# Patient Record
Sex: Female | Born: 1960 | Race: White | Hispanic: No | Smoking: Current every day smoker
Health system: Southern US, Community
[De-identification: ages and names within clinical notes are randomized; demographics above are authoritative.]

## PROBLEM LIST (undated history)

## (undated) DIAGNOSIS — F419 Anxiety disorder, unspecified: Secondary | ICD-10-CM

## (undated) DIAGNOSIS — F319 Bipolar disorder, unspecified: Secondary | ICD-10-CM

## (undated) DIAGNOSIS — T7840XA Allergy, unspecified, initial encounter: Secondary | ICD-10-CM

## (undated) DIAGNOSIS — F1910 Other psychoactive substance abuse, uncomplicated: Secondary | ICD-10-CM

## (undated) DIAGNOSIS — R51 Headache: Secondary | ICD-10-CM

## (undated) HISTORY — PX: TUBAL LIGATION: SHX77

## (undated) HISTORY — DX: Other psychoactive substance abuse, uncomplicated: F19.10

## (undated) HISTORY — DX: Allergy, unspecified, initial encounter: T78.40XA

---

## 1999-12-07 ENCOUNTER — Other Ambulatory Visit: Admission: RE | Admit: 1999-12-07 | Discharge: 1999-12-07 | Payer: Self-pay | Admitting: Obstetrics and Gynecology

## 1999-12-17 ENCOUNTER — Encounter: Admission: RE | Admit: 1999-12-17 | Discharge: 1999-12-17 | Payer: Self-pay | Admitting: Family Medicine

## 1999-12-17 ENCOUNTER — Ambulatory Visit (HOSPITAL_COMMUNITY): Admission: RE | Admit: 1999-12-17 | Discharge: 1999-12-17 | Payer: Self-pay | Admitting: Obstetrics and Gynecology

## 1999-12-17 ENCOUNTER — Encounter: Payer: Self-pay | Admitting: Obstetrics and Gynecology

## 1999-12-17 ENCOUNTER — Encounter: Payer: Self-pay | Admitting: Family Medicine

## 2001-01-03 ENCOUNTER — Other Ambulatory Visit: Admission: RE | Admit: 2001-01-03 | Discharge: 2001-01-03 | Payer: Self-pay | Admitting: Obstetrics and Gynecology

## 2005-08-05 ENCOUNTER — Emergency Department (HOSPITAL_COMMUNITY): Admission: EM | Admit: 2005-08-05 | Discharge: 2005-08-05 | Payer: Self-pay | Admitting: Emergency Medicine

## 2005-08-07 ENCOUNTER — Emergency Department (HOSPITAL_COMMUNITY): Admission: EM | Admit: 2005-08-07 | Discharge: 2005-08-07 | Payer: Self-pay | Admitting: Family Medicine

## 2010-01-05 ENCOUNTER — Ambulatory Visit: Payer: Self-pay | Admitting: Cardiology

## 2010-01-05 ENCOUNTER — Encounter (INDEPENDENT_AMBULATORY_CARE_PROVIDER_SITE_OTHER): Payer: Self-pay | Admitting: Emergency Medicine

## 2010-01-05 ENCOUNTER — Observation Stay (HOSPITAL_COMMUNITY): Admission: EM | Admit: 2010-01-05 | Discharge: 2010-01-05 | Payer: Self-pay | Admitting: Emergency Medicine

## 2010-01-06 ENCOUNTER — Encounter (INDEPENDENT_AMBULATORY_CARE_PROVIDER_SITE_OTHER): Payer: Self-pay | Admitting: *Deleted

## 2010-02-22 ENCOUNTER — Ambulatory Visit: Payer: Self-pay | Admitting: Gastroenterology

## 2010-02-22 DIAGNOSIS — R079 Chest pain, unspecified: Secondary | ICD-10-CM

## 2010-02-22 DIAGNOSIS — R1310 Dysphagia, unspecified: Secondary | ICD-10-CM | POA: Insufficient documentation

## 2010-03-24 ENCOUNTER — Ambulatory Visit: Payer: Self-pay | Admitting: Gastroenterology

## 2010-03-24 DIAGNOSIS — R1011 Right upper quadrant pain: Secondary | ICD-10-CM

## 2010-03-24 LAB — CONVERTED CEMR LAB
Bilirubin, Direct: 0.1 mg/dL (ref 0.0–0.3)
Total Bilirubin: 0.6 mg/dL (ref 0.3–1.2)
Total Protein: 6.1 g/dL (ref 6.0–8.3)

## 2010-03-26 ENCOUNTER — Telehealth: Payer: Self-pay | Admitting: Gastroenterology

## 2010-03-29 ENCOUNTER — Telehealth: Payer: Self-pay | Admitting: Gastroenterology

## 2010-04-01 ENCOUNTER — Ambulatory Visit (HOSPITAL_COMMUNITY): Admission: RE | Admit: 2010-04-01 | Discharge: 2010-04-01 | Payer: Self-pay | Admitting: Gastroenterology

## 2010-04-01 ENCOUNTER — Telehealth: Payer: Self-pay | Admitting: Gastroenterology

## 2010-04-07 ENCOUNTER — Telehealth (INDEPENDENT_AMBULATORY_CARE_PROVIDER_SITE_OTHER): Payer: Self-pay | Admitting: *Deleted

## 2010-04-07 ENCOUNTER — Ambulatory Visit: Payer: Self-pay | Admitting: Gastroenterology

## 2010-04-08 DIAGNOSIS — K811 Chronic cholecystitis: Secondary | ICD-10-CM | POA: Insufficient documentation

## 2010-04-27 ENCOUNTER — Encounter: Payer: Self-pay | Admitting: Gastroenterology

## 2010-06-13 HISTORY — PX: CHOLECYSTECTOMY: SHX55

## 2010-07-04 ENCOUNTER — Encounter: Payer: Self-pay | Admitting: Obstetrics

## 2010-07-06 LAB — DIFFERENTIAL
Basophils Absolute: 0.1 10*3/uL (ref 0.0–0.1)
Basophils Relative: 1 % (ref 0–1)
Eosinophils Absolute: 0.2 10*3/uL (ref 0.0–0.7)
Eosinophils Relative: 3 % (ref 0–5)
Lymphocytes Relative: 30 % (ref 12–46)
Lymphs Abs: 1.7 10*3/uL (ref 0.7–4.0)
Monocytes Absolute: 0.5 10*3/uL (ref 0.1–1.0)
Monocytes Relative: 9 % (ref 3–12)
Neutro Abs: 3.3 10*3/uL (ref 1.7–7.7)
Neutrophils Relative %: 58 % (ref 43–77)

## 2010-07-06 LAB — COMPREHENSIVE METABOLIC PANEL WITH GFR
ALT: 14 U/L (ref 0–35)
AST: 16 U/L (ref 0–37)
Albumin: 3.6 g/dL (ref 3.5–5.2)
Alkaline Phosphatase: 76 U/L (ref 39–117)
BUN: 14 mg/dL (ref 6–23)
CO2: 27 meq/L (ref 19–32)
Calcium: 9.8 mg/dL (ref 8.4–10.5)
Chloride: 108 meq/L (ref 96–112)
Creatinine, Ser: 0.81 mg/dL (ref 0.4–1.2)
GFR calc non Af Amer: >60 mL/min
Glucose, Bld: 93 mg/dL (ref 70–99)
Potassium: 3.6 meq/L (ref 3.5–5.1)
Sodium: 144 meq/L (ref 135–145)
Total Bilirubin: 0.3 mg/dL (ref 0.3–1.2)
Total Protein: 6.6 g/dL (ref 6.0–8.3)

## 2010-07-06 LAB — CBC
HCT: 40.9 % (ref 36.0–46.0)
Hemoglobin: 13.7 g/dL (ref 12.0–15.0)
MCH: 31.4 pg (ref 26.0–34.0)
MCHC: 33.5 g/dL (ref 30.0–36.0)
MCV: 93.6 fL (ref 78.0–100.0)
Platelets: 215 K/uL (ref 150–400)
RBC: 4.37 MIL/uL (ref 3.87–5.11)
RDW: 13.9 % (ref 11.5–15.5)
WBC: 5.7 K/uL (ref 4.0–10.5)

## 2010-07-09 ENCOUNTER — Ambulatory Visit (HOSPITAL_COMMUNITY)
Admission: RE | Admit: 2010-07-09 | Discharge: 2010-07-09 | Payer: Self-pay | Source: Home / Self Care | Attending: General Surgery | Admitting: General Surgery

## 2010-07-12 NOTE — Op Note (Signed)
Deborah Cobb, Deborah Cobb                 ACCOUNT NO.:  [id number]  MEDICAL RECORD NO.:  [id number]          PATIENT TYPE:  AMB  LOCATION:  DAY                          FACILITY:  [facility]  PHYSICIAN:  Juanetta Gosling, MDDATE OF BIRTH:  [birth year]  DATE OF PROCEDURE: DATE OF DISCHARGE:                              OPERATIVE REPORT   PREOPERATIVE DIAGNOSIS:  Chronic cholecystitis.  POSTOPERATIVE DIAGNOSIS:  Chronic cholecystitis.  PROCEDURE:  Laparoscopic cholecystectomy.  SURGEON:  Juanetta Gosling, M.D.  ASSISTANT:  Thornton Park. Daphine Deutscher, M.D.  ANESTHESIA:  General.  SPECIMENS:  Gallbladder to Pathology.  BLOOD LOSS:  Minimal.  COMPLICATIONS:  None.  DRAINS:  None.  DISPOSITION:  To recovery room in stable condition.  INDICATIONS:  This is a 50 year old female with nausea, belching, epigastric and substernal pain since March.  She has been evaluated completely for a cardiac source which has been negative as well by Dr. Arlyce Dice with a negative EGD.  She does have a right upper quadrant ultrasound showing a partially contracted gallbladder with diffuse wall thickening up to 5-mm and some diffuse adenomyomatosis.  She clinically had gallbladder disease and I discussed with her a laparoscopic cholecystectomy.  PROCEDURE IN DETAIL:  After informed consent was obtained, she was taken to the operating room.  She was administered 1 gram of intravenous cefoxitin.  Sequential compression devices were placed on the lower extremities prior to induction of anesthesia.  She was then placed under general endotracheal anesthesia without complication.  Her abdomen was prepped and draped in the standard sterile surgical fashion.  A surgical time-out was then performed.  I infiltrated 0.25% Marcaine below her umbilicus and made a vertical incision with an 11 blade, identified the fascia, entered this sharply and entered the peritoneum bluntly.  A 0-Vicryl pursestring suture was placed  through the fascia.  A Hasson trocar was then introduced.  The abdomen was then insufflated to 15 mmHg pressure.  I then inserted three further 5-mm trocars in the epigastrium and right upper quadrant under direct vision after infiltration with local anesthetic without complications.  Her gallbladder was noted to be very scarred and there was omentum that was very adherent to her gallbladder.  It took about 45 minutes to dissect the omentum off of the gallbladder.  There was no bowel that was adherent to the gallbladder.  Eventually I was able to trace this down towards the triangle of Calot.  The triangle of Calot was very difficult again to dissect due to the amount of chronic inflammation she had in this region but eventually I was able to very clearly obtain a critical view of safety.  I then clipped the artery three times and divided it and treated the cystic duct in a similar fashion.  The gallbladder was then removed from the liver bed.  The gallbladder was very woody and when it was removed, I certainly sent it off to Pathology, but it did come off the liver completely.  This was placed in an EndoCatch bag and removed from the umbilicus.  Hemostasis was obtained.  I did place a piece of Surgicel snow just due  to the amount of dissection it had taken on the liver.  Irrigation was performed.  This was clear.  I then removed the Hasson trocar and tied this down.  There was no evidence of any further defect at the umbilicus.  The abdomen was then desufflated.  The remainder of her incisions were then closed with 4-0 Monocryl in a subcuticular fashion. She tolerated this well, was extubated in the operating room and transferred to the recovery room in stable condition.     Juanetta Gosling, MD     MCW/MEDQ  D:  07/09/2010  T:  07/09/2010  Job:  [id number]  cc:   Barbette Hair. Arlyce Dice, MD,FACG [address] [place] [address]  Electronically Signed by Emelia Loron MD  on 07/12/2010 11:30:11 AM

## 2010-07-15 NOTE — Assessment & Plan Note (Signed)
Summary: heartburn/vomiting/upper gastric pain--ch.   History of Present Illness Visit Type: Initial Visit Primary GI MD: Melvia Heaps MD [facility] Requesting Provider: n/a Chief Complaint: severe  chest pain/non-cardiac History of Present Illness:   Deborah Cobb is a pleasant 50 year old white female referred from  the ER for evaluation of chest pain.  She was hospitalized in July, 2011 for what turned out to be noncardiac chest pain.  She  complains of severe reflux and dysphagia to solids.  At times she has had chest pain at rest secondary to severe burning chest discomfort.  She is on no gastric irritants including nonsteroidals.  She takes Zantac one to 2 times a day.   GI Review of Systems    Reports abdominal pain, acid reflux, belching, bloating, chest pain, dysphagia with solids, and  nausea.     Location of  Abdominal pain: epigastric area.    Denies dysphagia with liquids, heartburn, loss of appetite, vomiting, vomiting blood, weight loss, and  weight gain.      Reports constipation.     Denies anal fissure, black tarry stools, change in bowel habit, diarrhea, diverticulosis, fecal incontinence, heme positive stool, hemorrhoids, irritable bowel syndrome, jaundice, light color stool, liver problems, rectal bleeding, and  rectal pain. Preventive Screening-Counseling & Management  Alcohol-Tobacco     Smoking Status: current      Drug Use:  no.      Current Medications (verified): 1)  Zantac 150 Mg Tabs (Ranitidine Hcl) .... Take 1 -2 Tablet By Mouth  Once Daily or Two Times A Day 2)  Xanax 0.25 Mg Tabs (Alprazolam) .... As Needed 3)  Ambien 5 Mg Tabs (Zolpidem Tartrate) .... As Needed At Bedtime  Allergies (verified): No Known Drug Allergies  Past History:  Past Medical History: Anxiety Disorder GERD  Past Surgical History: Breast-Lumpectomy @ 50 years of age  Family History: Family History of Stomach Cancer:Father  Social History: Occupation:RN  Patient currently  smokes.  Alcohol Use - no Daily Caffeine Use 6 Illicit Drug Use - no Smoking Status:  current Drug Use:  no  Review of Systems       The patient complains of anxiety-new, fatigue, muscle pains/cramps, and sleeping problems.  The patient denies allergy/sinus, anemia, arthritis/joint pain, back pain, blood in urine, breast changes/lumps, change in vision, confusion, cough, coughing up blood, depression-new, fainting, fever, headaches-new, hearing problems, heart murmur, heart rhythm changes, itching, menstrual pain, night sweats, nosebleeds, pregnancy symptoms, shortness of breath, skin rash, sore throat, swelling of feet/legs, swollen lymph glands, thirst - excessive , urination - excessive , urination changes/pain, urine leakage, vision changes, and voice change.         All other systems were reviewed and were negative   Vital Signs:  Patient profile:   50 year old female Height:      65 inches Weight:      159.25 pounds BMI:     26.60 Pulse rate:   68 / minute Pulse rhythm:   regular BP sitting:   104 / 70  (left arm) Cuff size:   regular  Vitals Entered By: June McMurray CMA Duncan Dull) (February 22, 2010 3:19 PM)  Physical Exam  Additional Exam:  On physical exam she is a well-developed well-nourished female  skin: anicteric HEENT: normocephalic; PEERLA; no nasal or pharyngeal abnormalities neck: supple nodes: no cervical lymphadenopathy chest: clear to ausculatation and percussion heart: no murmurs, gallops, or rubs abd: soft, nontender; BS normoactive; no abdominal masses, tenderness, organomegaly rectal: deferred ext: no  cynanosis, clubbing, edema skeletal: no deformities neuro: oriented x 3; no focal abnormalities    Impression & Recommendations:  Problem # 1:  CHEST PAIN UNSPECIFIED (ICD-786.50) Chest pain and dysphagia are probably related to esophageal stricture and esophageal reflux.  She may have a component of esophageal spasm as well.  Recommendations #1  begin Nexium 40 mg daily #2 upper endoscopy with dilatation as indicated  Risks, alternatives, and complications of the procedure, including bleeding, perforation, and possible need for surgery, were explained to the patient.  Patient's questions were answered.  Problem # 2:  SPECIAL SCREENING FOR MALIGNANT NEOPLASMS COLON (ICD-V76.51) Plan screening colonoscopy  Other Orders: EGD (EGD)  Patient Instructions: 1)  We are giving you Nexium Samples today 2)  Your Endoscopy is scheduled for 03/24/2010 at 1:30pm 3)  Please contact our office to schedule an elective colonoscopy 4)  The medication list was reviewed and reconciled.  All changed / newly prescribed medications were explained.  A complete medication list was provided to the patient / caregiver. Prescriptions: NEXIUM 40 MG CPDR (ESOMEPRAZOLE MAGNESIUM) take one tab before breakfast daily  #30 x 2   Entered and Authorized by:   Louis Meckel MD   Signed by:   Louis Meckel MD on 02/23/2010   Method used:   Electronically to        [facility] [phone]* (retail)       [address]       [place], [place]  [address]       Ph: [phone] or [phone]       Fax: [phone]   RxID:   [id number]

## 2010-07-15 NOTE — Letter (Signed)
Summary: Results Letter  [facility]  [address] [place], [place] [address]   Phone: [phone]  Fax: [phone]        February 22, 2010 MRN: [id number]    [facility] [address] [place], [place]  [address]    Dear Ms. Deborah Cobb,  It is my pleasure to have treated you recently as a new patient in my office. I appreciate your confidence and the opportunity to participate in your care.  Since I do have a busy inpatient endoscopy schedule and office schedule, my office hours vary weekly. I am, however, available for emergency calls everyday through my office. If I am not available for an urgent office appointment, another one of our gastroenterologist will be able to assist you.  My well-trained staff are prepared to help you at all times. For emergencies after office hours, a physician from our Gastroenterology section is always available through my 24 hour answering service  Once again I welcome you as a new patient and I look forward to a happy and healthy relationship             Sincerely,  Louis Meckel MD  This letter has been electronically signed by your physician.  Appended Document: Results Letter letter mailed

## 2010-07-15 NOTE — Progress Notes (Signed)
Summary: Appt made @ [facility]  Phone Note Outgoing Call   Call placed by: Joselyn Glassman,  April 07, 2010 4:03 PM Call placed to: Patient Summary of Call: Called pt to inform her that we made the appt for her to see Dr. Emelia Loron at [facility] on 04-27-10.  She is to arrive at 8:45 AM and his appt is for 9:10 AM.  I left her a message on her home phone [phone] and asked her to please call Robin to let her know she got our message.  Initial call taken by: Joselyn Glassman,  April 07, 2010 4:04 PM  Follow-up for Phone Call        All records have been faxed Follow-up by: Merri Ray CMA Duncan Dull),  April 08, 2010 8:16 AM     Appended Document: Orders Update    Clinical Lists Changes  Problems: Added new problem of CHOLECYSTITIS, CHRONIC (ICD-575.11) Orders: Added new Test order of [facility] ([facility]) - Signed

## 2010-07-15 NOTE — Progress Notes (Signed)
Summary: med and procedure ?'s  Phone Note Call from Patient Call back at Home Phone [phone]   Caller: Patient Call For: Dr. Arlyce Dice Reason for Call: Talk to Nurse Summary of Call: 1. not sure if what med she is supposed to be taking for reflux 2. has questions regarding upcoming [place] Initial call taken by: Vallarie Mare,  March 29, 2010 9:26 AM  Follow-up for Phone Call        Pt called wanted to know if she should start taking Dexilant instead of Nexium. Told her yes to stop Nexium and start Dexilant. Also gave her the time of her U/S as well. She shecduled appointment with Dr Nita Sells first available. Explained to her that he wanted her seen in 2 weeks but she said she was ok and labs are normal she would take first available appointment and contact us if symptoms worsen. Follow-up by: Merri Ray CMA Duncan Dull),  March 29, 2010 9:43 AM

## 2010-07-15 NOTE — Progress Notes (Signed)
Summary: Scheduled U/S  Phone Note Outgoing Call Call back at [facility] Phone [phone]   Call placed by: Merri Ray CMA Duncan Dull),  March 26, 2010 9:45 AM Action Taken: Appt scheduled Summary of Call: Appointment for abdominal U/S scheduled on 04/01/2010 at 8am to arrive at 7:45am. L/M for pt. Also L/M for pt that she needs a follow up office visit for 2 weeks with Dr Arlyce Dice. Initial call taken by: Merri Ray CMA Duncan Dull),  March 26, 2010 9:46 AM  Follow-up for Phone Call        Orders completed Follow-up by: Merri Ray CMA Duncan Dull),  March 26, 2010 9:47 AM

## 2010-07-15 NOTE — Letter (Signed)
Summary: [name]   Imported By: Lennie Odor 05/10/2010 13:56:42  _____________________________________________________________________  External Attachment:    Type:   Image     Comment:   External Document

## 2010-07-15 NOTE — Letter (Signed)
Summary: New Patient letter  [facility]  [address] [place], [place] [address]   Phone: [phone]  Fax: [phone]       01/06/2010 MRN: [id number]  Deborah Cobb [address] [place], [place]  [address]  Dear Ms. Cathleen Corti,  Welcome to the Gastroenterology Division at [facility].    You are scheduled to see Dr.  Arlyce Dice on 02-22-10 at 3:00p.m. on the 3rd floor at [facility], [address]  We ask that you try to arrive at our office 15 minutes prior to your appointment time to allow for check-in.  We would like you to complete the enclosed self-administered evaluation form prior to your visit and bring it with you on the day of your appointment.  We will review it with you.  Also, please bring a complete list of all your medications or, if you prefer, bring the medication bottles and we will list them.  Please bring your insurance card so that we may make a copy of it.  If your insurance requires a referral to see a specialist, please bring your referral form from your primary care physician.  Co-payments are due at the time of your visit and may be paid by cash, check or credit card.     Your office visit will consist of a consult with your physician (includes a physical exam), any laboratory testing he/she may order, scheduling of any necessary diagnostic testing (e.g. x-ray, ultrasound, CT-scan), and scheduling of a procedure (e.g. Endoscopy, Colonoscopy) if required.  Please allow enough time on your schedule to allow for any/all of these possibilities.    If you cannot keep your appointment, please call [phone] to cancel or reschedule prior to your appointment date.  This allows [place] the opportunity to schedule an appointment for another patient in need of care.  If you do not cancel or reschedule by 5 p.m. the business day prior to your appointment date, you will be charged a $50.00 late cancellation/no-show fee.    Thank you for choosing  [facility] for your medical needs.  We appreciate the opportunity to care for you.  Please visit [place] at our website  to learn more about our practice.                     Sincerely,                                                             The Gastroenterology Division

## 2010-07-15 NOTE — Procedures (Signed)
Summary: Upper Endoscopy  Patient: Deborah Cobb Note: All result statuses are Final unless otherwise noted.  Tests: (1) Upper Endoscopy (EGD)   EGD Upper Endoscopy       DONE (C)     [facility]     [address]     [place], [place]  [address]           ENDOSCOPY PROCEDURE REPORT           PATIENT:  Deborah Cobb, Deborah Cobb  MR#:  [id number]     BIRTHDATE:  03/08/1961, 49 yrs. old  GENDER:  female           ENDOSCOPIST:  Barbette Hair. Arlyce Dice, MD     Referred by:           PROCEDURE DATE:  03/24/2010     PROCEDURE:  EGD, diagnostic     ASA CLASS:  Class I     INDICATIONS:  chest pain           MEDICATIONS:   Fentanyl 50 mcg IV, Versed 6 mg IV, glycopyrrolate     (Robinal) 0.2 mg IV, 0.6cc simethancone 0.6 cc PO     TOPICAL ANESTHETIC:  Exactacain Spray           DESCRIPTION OF PROCEDURE:   After the risks benefits and     alternatives of the procedure were thoroughly explained, informed     consent was obtained.  The [facility] [id number] endoscope was     introduced through the mouth and advanced to the third portion of     the duodenum, without limitations.  The instrument was slowly     withdrawn as the mucosa was fully examined.     <<PROCEDUREIMAGES>>           The upper, middle, and distal third of the esophagus were     carefully inspected and no abnormalities were noted. The z-line     was well seen at the GEJ. The endoscope was pushed into the fundus     which was normal including a retroflexed view. The antrum,gastric     body, first and second part of the duodenum were unremarkable (see     image1, image3, and image4).    Retroflexed views revealed no     abnormalities.    The scope was then withdrawn from the patient     and the procedure completed.           COMPLICATIONS:  None           ENDOSCOPIC IMPRESSION:     1) Normal EGD     RECOMMENDATIONS:     1) trial dexilant 60mg  bid     2) LFTs, amylase; abd ultrasound     3) antacids as needed     4) OV 1-2  weeks           REPEAT EXAM:  No           ______________________________     Barbette Hair. Arlyce Dice, MD           CC:           n.     REVISED:  03/24/2010 02:11 PM     eSIGNED:   Barbette Hair. Kaplan at 03/24/2010 02:11 PM           Janalyn Shy, [id number]  Note: An exclamation mark (!) indicates a result that was not dispersed into the flowsheet. Document Creation Date: 03/24/2010 2:12 PM  _______________________________________________________________________  (1) Order result status: Final Collection or observation date-time: 03/24/2010 13:56 Requested date-time:  Receipt date-time:  Reported date-time:  Referring Physician:   Ordering Physician: Melvia Heaps [phone]) Specimen Source:  Source: Launa Grill Order Number: [id number] Lab site:

## 2010-07-15 NOTE — Progress Notes (Signed)
Summary: Schedule OV  Phone Note Outgoing Call Call back at [facility] Phone [phone]   Call placed by: Merri Ray CMA Duncan Dull),  April 01, 2010 1:53 PM Summary of Call: Called pt to inform of test. And to schedule office appointment Initial call taken by: Merri Ray CMA Duncan Dull),  April 01, 2010 1:54 PM  Follow-up for Phone Call        Scheduled pt for follow up with Dr Arlyce Dice on 04/07/2010 at 10:45am Follow-up by: Merri Ray CMA Duncan Dull),  April 02, 2010 10:08 AM

## 2010-07-15 NOTE — Letter (Signed)
Summary: EGD Instructions  [facility]  [address] [place], [place] [address]   Phone: [phone]  Fax: [phone]       Deborah Cobb    August 02, 1960    MRN: [id number]       Procedure Day /Date:WEDNESDAY 03/24/2010     Arrival Time: 12:30PM     Procedure Time:1:30PM     Location of Procedure:                    X  [facility] (4th Floor)    PREPARATION FOR ENDOSCOPY/DIL   On10/12/2011THE DAY OF THE PROCEDURE:  1.   No solid foods, milk or milk products are allowed after midnight the night before your procedure.  2.   Do not drink anything colored red or purple.  Avoid juices with pulp.  No orange juice.  3.  You may drink clear liquids until11:30AM, which is 2 hours before your procedure.                                                                                                CLEAR LIQUIDS INCLUDE: Water Jello Ice Popsicles Tea (sugar ok, no milk/cream) Powdered fruit flavored drinks Coffee (sugar ok, no milk/cream) Gatorade Juice: apple, white grape, white cranberry  Lemonade Clear bullion, consomm, broth Carbonated beverages (any kind) Strained chicken noodle soup Hard Candy   MEDICATION INSTRUCTIONS  Unless otherwise instructed, you should take regular prescription medications with a small sip of water as early as possible the morning of your procedure.            OTHER INSTRUCTIONS  You will need a responsible adult at least 52 years of age to accompany you and drive you home.   This person must remain in the waiting room during your procedure.  Wear loose fitting clothing that is easily removed.  Leave jewelry and other valuables at home.  However, you may wish to bring a book to read or an iPod/MP3 player to listen to music as you wait for your procedure to start.  Remove all body piercing jewelry and leave at home.  Total time from sign-in until discharge is approximately 2-3 hours.  You should go home  directly after your procedure and rest.  You can resume normal activities the day after your procedure.  The day of your procedure you should not:   Drive   Make legal decisions   Operate machinery   Drink alcohol   Return to work  You will receive specific instructions about eating, activities and medications before you leave.    The above instructions have been reviewed and explained to me by   _______________________    I fully understand and can verbalize these instructions _____________________________ Date _________

## 2010-07-15 NOTE — Assessment & Plan Note (Signed)
Summary: F/U FROM U/S PER DR KAPLAN   History of Present Illness Visit Type: Follow-up Visit Primary GI MD: Deborah Heaps MD [facility] Primary Provider: na Requesting Provider: n/a Chief Complaint: F/u from [place]. Pt states that she still does not feel better  History of Present Illness:   Deborah Cobb has returned forfollowup of her abdominal pain.  She continues to complain of postprandial nausea, bloating and upper abdominal discomfort.  Endoscopy was unrevealing.  Abdominal ultrasound, which I reviewed, demonstrated a diffusely thickened gallbladder wall and a foci of echogenicity with shadowing raising a question of small gallbladder stones.  She has not had any further episodes of chest pain.   GI Review of Systems      Denies abdominal pain, acid reflux, belching, bloating, chest pain, dysphagia with liquids, dysphagia with solids, heartburn, loss of appetite, nausea, vomiting, vomiting blood, weight loss, and  weight gain.        Denies anal fissure, black tarry stools, change in bowel habit, constipation, diarrhea, diverticulosis, fecal incontinence, heme positive stool, hemorrhoids, irritable bowel syndrome, jaundice, light color stool, liver problems, rectal bleeding, and  rectal pain.    Current Medications (verified): 1)  Xanax 0.25 Mg Tabs (Alprazolam) .... As Needed 2)  Ambien 5 Mg Tabs (Zolpidem Tartrate) .... As Needed At Bedtime 3)  Dexilant 60 Mg Cpdr (Dexlansoprazole) .... One Tablet By Mouth Once Daily  Allergies (verified): No Known Drug Allergies  Past History:  Past Medical History: Anxiety Disorder GERD Cholecystitis  ABDOMINAL PAIN, RIGHT UPPER QUADRANT (ICD-789.01) SPECIAL SCREENING FOR MALIGNANT NEOPLASMS COLON (ICD-V76.51) DYSPHAGIA UNSPECIFIED (ICD-787.20) CHEST PAIN UNSPECIFIED (ICD-786.50)  Past Surgical History: Reviewed history from 02/22/2010 and no changes required. Breast-Lumpectomy @ 50 years of age  Family History: Family History of Stomach  Cancer:Father No FH of Colon Cancer:  Social History: Reviewed history from 02/22/2010 and no changes required. Occupation:RN  Patient currently smokes.  Alcohol Use - no Daily Caffeine Use 6 Illicit Drug Use - no  Review of Systems  The patient denies allergy/sinus, anemia, anxiety-new, arthritis/joint pain, back pain, blood in urine, breast changes/lumps, change in vision, confusion, cough, coughing up blood, depression-new, fainting, fatigue, fever, headaches-new, hearing problems, heart murmur, heart rhythm changes, itching, menstrual pain, muscle pains/cramps, night sweats, nosebleeds, pregnancy symptoms, shortness of breath, skin rash, sleeping problems, sore throat, swelling of feet/legs, swollen lymph glands, thirst - excessive , urination - excessive , urination changes/pain, urine leakage, vision changes, and voice change.    Vital Signs:  Patient profile:   50 year old female Height:      65 inches Weight:      160 pounds BMI:     26.72 BSA:     1.80 Pulse rate:   68 / minute Pulse rhythm:   regular BP sitting:   112 / 60  (left arm) Cuff size:   regular  Vitals Entered By: Ok Anis CMA (April 07, 2010 10:39 AM)   Impression & Recommendations:  Problem # 1:  ABDOMINAL PAIN, RIGHT UPPER QUADRANT (ICD-789.01) Assessment New Symptoms are most likely due to chronic cholecystitis.  She is not improved with PPI therapy and endoscopy was negative Recommendations #1 surgical consultation  Problem # 2:  SPECIAL SCREENING FOR MALIGNANT NEOPLASMS COLON (ICD-V76.51) Screening colonoscopy following resolution of assessment #1.  Patient Instructions: 1)  We are referring you to see Dr Dwain Sarna at [facility]  2)  We will call you with an appointment when it becomes available 3)  The medication list was reviewed  and reconciled.  All changed / newly prescribed medications were explained.  A complete medication list was provided to the patient / caregiver.

## 2010-07-15 NOTE — Miscellaneous (Signed)
Summary: new lab orders  Clinical Lists Changes  Problems: Added new problem of ABDOMINAL PAIN, RIGHT UPPER QUADRANT (ICD-789.01) Orders: Added new Test order of TLB-Amylase (82150-AMYL) - Signed Added new Test order of TLB-Hepatic/Liver Function Pnl (80076-HEPATIC) - Signed

## 2010-07-26 ENCOUNTER — Encounter: Payer: Self-pay | Admitting: Gastroenterology

## 2010-08-19 NOTE — Letter (Signed)
Summary: [facility]   Imported By: Lennie Odor 08/09/2010 11:21:50  _____________________________________________________________________  External Attachment:    Type:   Image     Comment:   External Document

## 2010-08-28 LAB — POCT CARDIAC MARKERS
CKMB, poc: <1 ng/mL — ABNORMAL LOW (ref 1.0–8.0)
Troponin i, poc: <0.05 ng/mL (ref 0.00–0.09)

## 2010-08-28 LAB — CBC
Hemoglobin: 14.3 g/dL (ref 12.0–15.0)
MCH: 32.8 pg (ref 26.0–34.0)
Platelets: 181 10*3/uL (ref 150–400)
RBC: 4.36 MIL/uL (ref 3.87–5.11)
WBC: 7.8 10*3/uL (ref 4.0–10.5)

## 2010-08-28 LAB — CK TOTAL AND CKMB (NOT AT ARMC)
CK, MB: 2.7 ng/mL (ref 0.3–4.0)
Relative Index: INVALID (ref 0.0–2.5)
Total CK: 91 U/L (ref 7–177)

## 2010-08-28 LAB — TROPONIN I: Troponin I: 0.01 ng/mL (ref 0.00–0.06)

## 2010-08-28 LAB — DIFFERENTIAL
Basophils Relative: 1 % (ref 0–1)
Eosinophils Absolute: 0.2 10*3/uL (ref 0.0–0.7)
Neutrophils Relative %: 49 % (ref 43–77)

## 2010-08-28 LAB — BASIC METABOLIC PANEL
CO2: 24 mEq/L (ref 19–32)
Calcium: 9.6 mg/dL (ref 8.4–10.5)
Creatinine, Ser: 0.85 mg/dL (ref 0.4–1.2)
GFR calc Af Amer: >60 mL/min (ref >60)
GFR calc non Af Amer: >60 mL/min (ref >60)
Sodium: 139 mEq/L (ref 135–145)

## 2010-08-28 LAB — D-DIMER, QUANTITATIVE: D-Dimer, Quant: <0.22 ug/mL-FEU (ref 0.00–0.48)

## 2012-04-10 ENCOUNTER — Ambulatory Visit (HOSPITAL_COMMUNITY): Payer: Self-pay | Admitting: Physician Assistant

## 2012-05-04 ENCOUNTER — Encounter (HOSPITAL_COMMUNITY): Payer: Self-pay

## 2012-05-04 ENCOUNTER — Emergency Department (HOSPITAL_COMMUNITY)
Admission: EM | Admit: 2012-05-04 | Discharge: 2012-05-05 | Disposition: A | Discharge status: Home / Self Care | Payer: 59 | Attending: Emergency Medicine | Admitting: Emergency Medicine

## 2012-05-04 DIAGNOSIS — F411 Generalized anxiety disorder: Secondary | ICD-10-CM | POA: Insufficient documentation

## 2012-05-04 DIAGNOSIS — F13239 Sedative, hypnotic or anxiolytic dependence with withdrawal, unspecified: Secondary | ICD-10-CM

## 2012-05-04 DIAGNOSIS — Z79899 Other long term (current) drug therapy: Secondary | ICD-10-CM | POA: Insufficient documentation

## 2012-05-04 DIAGNOSIS — F17200 Nicotine dependence, unspecified, uncomplicated: Secondary | ICD-10-CM | POA: Insufficient documentation

## 2012-05-04 DIAGNOSIS — F19939 Other psychoactive substance use, unspecified with withdrawal, unspecified: Secondary | ICD-10-CM | POA: Insufficient documentation

## 2012-05-04 HISTORY — DX: Anxiety disorder, unspecified: F41.9
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
## 2012-05-04 NOTE — ED Notes (Signed)
Pt denies SI/HI. Pt calm, cooperative.

## 2012-05-05 MED ORDER — ALPRAZOLAM 0.5 MG PO TABS: 0.5000 mg | ORAL_TABLET | Freq: Every evening | ORAL | Status: DC | PRN

## 2012-05-05 MED ORDER — ALPRAZOLAM 0.5 MG PO TABS
0.5000 mg | ORAL_TABLET | Freq: Once | ORAL | Status: AC
  Administered 2012-05-05: 0.5 mg via ORAL
  Filled 2012-05-05: qty 1
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
## 2012-09-25 ENCOUNTER — Telehealth: Payer: Self-pay

## 2012-09-25 NOTE — Telephone Encounter (Signed)
Error

## 2012-09-28 ENCOUNTER — Telehealth: Payer: Self-pay

## 2012-09-28 NOTE — Telephone Encounter (Signed)
She wants you to accept her as a patient.

## 2012-09-28 NOTE — Telephone Encounter (Signed)
Pt came in to see dr Neva Seat but states she cant wait long wait times because she has vertigo and sees lines. She wants to see him for her primary care. States she cant get a new pt appt with dr Neva Seat but hes not taking new pts, but he sees her son, Caryn Bee, and she [facility] she [facility] a Engineer, civil (consulting) and his residency.  i gave her dr Haze Justin hours this weekend and she said she'd probably see him Sunday but asked that i still give him this message.   Best: [phone]

## 2012-09-28 NOTE — Telephone Encounter (Signed)
error 

## 2012-09-29 NOTE — Telephone Encounter (Signed)
No problem - I do not remember closing to new patients. I will be happy to see her.

## 2012-09-30 ENCOUNTER — Encounter: Payer: Self-pay | Admitting: Family Medicine

## 2012-09-30 ENCOUNTER — Ambulatory Visit (HOSPITAL_COMMUNITY)
Admission: RE | Admit: 2012-09-30 | Discharge: 2012-09-30 | Disposition: A | Discharge status: Home / Self Care | Payer: 59 | Source: Ambulatory Visit | Attending: Family Medicine | Admitting: Family Medicine

## 2012-09-30 ENCOUNTER — Ambulatory Visit (INDEPENDENT_AMBULATORY_CARE_PROVIDER_SITE_OTHER): Payer: 59 | Admitting: Family Medicine

## 2012-09-30 ENCOUNTER — Emergency Department (HOSPITAL_COMMUNITY): Payer: 59

## 2012-09-30 ENCOUNTER — Emergency Department (HOSPITAL_COMMUNITY)
Admission: EM | Admit: 2012-09-30 | Discharge: 2012-09-30 | Disposition: A | Discharge status: Home / Self Care | Payer: 59 | Attending: Emergency Medicine | Admitting: Emergency Medicine

## 2012-09-30 ENCOUNTER — Encounter (HOSPITAL_COMMUNITY): Payer: Self-pay | Admitting: Physical Medicine and Rehabilitation

## 2012-09-30 VITALS — BP 118/72 | HR 58 | Temp 98.0°F | Resp 16 | Ht 65.0 in | Wt 147.0 lb

## 2012-09-30 DIAGNOSIS — F1910 Other psychoactive substance abuse, uncomplicated: Secondary | ICD-10-CM | POA: Insufficient documentation

## 2012-09-30 DIAGNOSIS — R413 Other amnesia: Secondary | ICD-10-CM | POA: Insufficient documentation

## 2012-09-30 DIAGNOSIS — R269 Unspecified abnormalities of gait and mobility: Secondary | ICD-10-CM

## 2012-09-30 DIAGNOSIS — F411 Generalized anxiety disorder: Secondary | ICD-10-CM | POA: Insufficient documentation

## 2012-09-30 DIAGNOSIS — Z79899 Other long term (current) drug therapy: Secondary | ICD-10-CM | POA: Insufficient documentation

## 2012-09-30 DIAGNOSIS — R262 Difficulty in walking, not elsewhere classified: Secondary | ICD-10-CM

## 2012-09-30 DIAGNOSIS — F419 Anxiety disorder, unspecified: Secondary | ICD-10-CM

## 2012-09-30 DIAGNOSIS — R209 Unspecified disturbances of skin sensation: Secondary | ICD-10-CM | POA: Insufficient documentation

## 2012-09-30 DIAGNOSIS — R4781 Slurred speech: Secondary | ICD-10-CM

## 2012-09-30 DIAGNOSIS — F17200 Nicotine dependence, unspecified, uncomplicated: Secondary | ICD-10-CM | POA: Insufficient documentation

## 2012-09-30 DIAGNOSIS — R42 Dizziness and giddiness: Secondary | ICD-10-CM

## 2012-09-30 DIAGNOSIS — R55 Syncope and collapse: Secondary | ICD-10-CM

## 2012-09-30 DIAGNOSIS — Z9181 History of falling: Secondary | ICD-10-CM | POA: Insufficient documentation

## 2012-09-30 DIAGNOSIS — H539 Unspecified visual disturbance: Secondary | ICD-10-CM

## 2012-09-30 DIAGNOSIS — R4789 Other speech disturbances: Secondary | ICD-10-CM | POA: Insufficient documentation

## 2012-09-30 LAB — COMPREHENSIVE METABOLIC PANEL
Albumin: 4.1 g/dL (ref 3.5–5.2)
Alkaline Phosphatase: 81 U/L (ref 39–117)
BUN: 15 mg/dL (ref 6–23)
CO2: 31 mEq/L (ref 19–32)
Glucose, Bld: 78 mg/dL (ref 70–99)
Potassium: 4.2 mEq/L (ref 3.5–5.3)

## 2012-09-30 LAB — POCT CBC
HCT, POC: 40.9 % (ref 37.7–47.9)
MCH, POC: 30.5 pg (ref 27–31.2)
MCV: 96.1 fL (ref 80–97)
MID (cbc): 0.5 (ref 0–0.9)
POC LYMPH PERCENT: 35.7 %L (ref 10–50)
Platelet Count, POC: 219 10*3/uL (ref 142–424)
RBC: 4.26 M/uL (ref 4.04–5.48)
RDW, POC: 14.3 %
WBC: 5.9 10*3/uL (ref 4.6–10.2)

## 2012-09-30 LAB — TSH: TSH: 1.942 u[IU]/mL (ref 0.350–4.500)

## 2012-09-30 NOTE — ED Notes (Signed)
Pt was here for CT scan and brought over from CT. Pt states she has had increase in neuro symptoms over the past month and has been falling a lot. Also states that she will veer to the right when walking.

## 2012-09-30 NOTE — ED Notes (Signed)
Pt in MRI at the time. 

## 2012-09-30 NOTE — Progress Notes (Signed)
1155 addendum.  Call report from CT dept at [facility] - no acute findings on CT head, but patient feeling more dizzy and had to lay down on stretcher.  Spoke to pt on phone - she feels more lightheaded, and disoriented since driving to hospital, and had trouble figuring out where to park.  Does not feel like she can get up to walk. Will have evaluated in the emergency room now.  Advised CT dept and discussed with [facility] charge nurse and will coordinate moving her to ER for eval.  CT head report: Clinical Data: Dizziness, slurred speech, unsteady gait.  CT HEAD WITHOUT CONTRAST  Technique: Contiguous axial images were obtained from the base of  the skull through the vertex without contrast.  Comparison: None.  Findings: No acute intracranial abnormality. Specifically, no  hemorrhage, hydrocephalus, mass lesion, acute infarction, or  significant intracranial injury. No acute calvarial abnormality.  Visualized paranasal sinuses and mastoids clear. Orbital soft  tissues unremarkable.  IMPRESSION:  Normal study.

## 2012-09-30 NOTE — Patient Instructions (Signed)
Call Hurley Cisco for appointment this week. We will refer you to neurology and psychiatrist, as well as schedule CT scan of brain. Return to the clinic or go to the nearest emergency room if any of your symptoms worsen or new symptoms occur.
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
## 2012-09-30 NOTE — ED Notes (Addendum)
Pt presents to department for evaluation of dizziness, falls, and anxiety. Pt states recent issues with falling and seeing lines in her vision, ongoing x2 months. Also states she feels dizzy and like she is walking sideways. States she is under a lot of stress at home. Pt has flat affect upon arrival to ED. She is conscious alert and oriented x4. Able to move all extremities. No neurological deficits noted.

## 2012-09-30 NOTE — ED Provider Notes (Signed)
History     CSN: [id number]  Arrival date & time 09/30/12  1213   First MD Initiated Contact with Patient 09/30/12 1218      Chief Complaint  Patient presents with  . Fall  . Dizziness     The history is provided by the patient.  difficulty walking Onset -a month ago Course - worsening Worsened by  - ambulation Improved by - rest  Pt reports for past month she will have difficulty walking.  She reports she will veer to right.  She reports falls due to this occurring, but denies actual syncope.  No cp/sob.  No headache.  No visual loss.  No focal arm or leg weakness.  Reports numbness in both of her legs, but no focal leg weakness. No back pain.  No neck pain.  No new meds.  Denies h/o CAD/CVA She reports some dizziness but denies actual vertigo currently She also reports some memory difficulty this past month  Past Medical History  Diagnosis Date  . Anxiety   . Substance abuse   . Allergy     Past Surgical History  Procedure Laterality Date  . Tubal ligation    . Cholecystectomy      Family History  Problem Relation Age of Onset  . Alcohol abuse Mother   . Heart disease Mother     A-fib/ CHF  . Alcohol abuse Father   . Arthritis Brother   . Gout Brother   . Depression Daughter   . Drug abuse Son     History  Substance Use Topics  . Smoking status: Current Every Day Smoker -- 1.00 packs/day    Types: Cigarettes  . Smokeless tobacco: Not on file  . Alcohol Use: No    OB History   Grav Para Term Preterm Abortions TAB SAB Ect Mult Living                  Review of Systems  Constitutional: Negative for fever.  HENT: Negative for neck pain.   Respiratory: Negative for shortness of breath.   Cardiovascular: Negative for chest pain.  Gastrointestinal: Negative for abdominal pain.  Musculoskeletal: Negative for back pain.  Neurological: Positive for dizziness. Negative for syncope and headaches.  Psychiatric/Behavioral: The patient is nervous/anxious.    All other systems reviewed and are negative.    Allergies  Ibuprofen  Home Medications   Current Outpatient Rx  Name  Route  Sig  Dispense  Refill  . chlordiazePOXIDE (LIBRIUM) 25 MG capsule   Oral   Take 25 mg by mouth 4 (four) times daily as needed for anxiety. For anxiety         . FLUoxetine (PROZAC) 10 MG capsule   Oral   Take 20 mg by mouth daily.         Marland Kitchen zolpidem (AMBIEN) 10 MG tablet   Oral   Take 5 mg by mouth at bedtime as needed for sleep. For sleep           BP 104/56  Pulse 53  Temp(Src) 97.1 F (36.2 C) (Oral)  Resp 20  SpO2 96%  Physical Exam CONSTITUTIONAL: Well developed/well nourished HEAD: Normocephalic/atraumatic EYES: EOMI/PERRL, no nystagmus, normal fundoscopic exam  ENMT: Mucous membranes moist NECK: supple no meningeal signs, no bruits SPINE:entire spine nontender CV: S1/S2 noted, no murmurs/rubs/gallops noted LUNGS: Lungs are clear to auscultation bilaterally, no apparent distress ABDOMEN: soft, nontender, no rebound or guarding GU:no cva tenderness NEURO:Awake/alert, facies symmetric, no arm or leg drift  is noted Cranial nerves 3/4/5/6/12/19/08/11/12 tested and intact No past pointing EXTREMITIES: pulses normal, full ROM SKIN: warm, color normal PSYCH: no abnormalities of mood noted   ED Course  Procedures  Labs Reviewed - No data to display Ct Head Wo Contrast  09/30/2012  *RADIOLOGY REPORT*  Clinical Data: Dizziness, slurred speech, unsteady gait.  CT HEAD WITHOUT CONTRAST  Technique:  Contiguous axial images were obtained from the base of the skull through the vertex without contrast.  Comparison: None.  Findings: No acute intracranial abnormality.  Specifically, no hemorrhage, hydrocephalus, mass lesion, acute infarction, or significant intracranial injury.  No acute calvarial abnormality. Visualized paranasal sinuses and mastoids clear.  Orbital soft tissues unremarkable.  IMPRESSION: Normal study.   Original Report  Authenticated By: Charlett Nose, M.D.    2:00 PM Pt presents from radiology dept.  She was here for CT head sent over by urgent care.  I reviewed their notes.  Pt has had difficulty walking and demonstrated unsteady gait while at urgent care.  Ct head already done and negative.  Will proceed with MRI given reported difficulty ambulating for past month, cerebellar infarct is possible.  She denies active CP/SOB.  Labs from urgent care reviewed.   She reported memory difficulty this past month as well but is able to recall details on my exam.    3:47 PM Mri negative She is able to ambulate in a straight line on my repeat exam She has no focal neuro deficits I feel she is safe for d/c and she has f/u arranged   MDM  Nursing notes including past medical history and social history reviewed and considered in documentation Previous records reviewed and considered - urgent care notes/labs reviewed        Date: 09/30/2012  Rate: 48  Rhythm: sinus bradycardia  QRS Axis: right  Intervals: normal  ST/T Wave abnormalities: nonspecific ST changes  Conduction Disutrbances:none  Narrative Interpretation:   Old EKG Reviewed: changes noted and rate is slower    Joya Gaskins, MD 09/30/12 1548

## 2012-10-01 ENCOUNTER — Encounter: Payer: Self-pay | Admitting: Family Medicine

## 2012-10-01 NOTE — Telephone Encounter (Signed)
Pt seen by Dr. Neva Seat 4/20, f-up appt made for 5/19.

## 2012-10-29 ENCOUNTER — Encounter: Payer: 59 | Admitting: Family Medicine

## 2012-10-29 NOTE — Progress Notes (Signed)
  Subjective:    Patient ID: Deborah Cobb, female    DOB: January 12, 1961, 52 y.o.   MRN: [id number]  HPI MYRNA VONSEGGERN is a 52 y.o. female See last ov 09/30/12 - complicated hx at that time.  Anxiety with multiple social stressors including son with drug abuse/addiction. Diagnosis of GAD, but with activities for "self punishment", with some manic type features. By report did not improve on depakote prior. Continued Prozac, librium as prior prescribed, but plan for her to call [facility] for appointment, and referred to [facility] for eval/psychiatiric care. Discussed continued walking for exercise.  Dizziness, lightheadedness, intermittent gait abnormality, syncope/near-syncope episodes. Visual change during one of episodes, reported memory and concentration difficulty. Suspected psychologic component with above, but sent for head CT (normal study) and labs below. EKG with sinus bradycardia - otherwise no acute findings.  More unsteady in hospital during CT scan - had evaluated in ER.  MRi of brain ordered - IMPRESSION: Normal noncontrast MRI appearance of the brain. Repeat EDP eval improved and plan on follow up with Neuro.    Results for orders placed in visit on 09/30/12  COMPREHENSIVE METABOLIC PANEL      Result Value Range   Sodium 139  135 - 145 mEq/L   Potassium 4.2  3.5 - 5.3 mEq/L   Chloride 104  96 - 112 mEq/L   CO2 31  19 - 32 mEq/L   Glucose, Bld 78  70 - 99 mg/dL   BUN 15  6 - 23 mg/dL   Creat 4.09  8.11 - 9.14 mg/dL   Total Bilirubin 0.3  0.3 - 1.2 mg/dL   Alkaline Phosphatase 81  39 - 117 U/L   AST 14  0 - 37 U/L   ALT 10  0 - 35 U/L   Total Protein 6.5  6.0 - 8.3 g/dL   Albumin 4.1  3.5 - 5.2 g/dL   Calcium 9.9  8.4 - 78.2 mg/dL  TSH      Result Value Range   TSH 1.942  0.350 - 4.500 uIU/mL  POCT CBC      Result Value Range   WBC 5.9  4.6 - 10.2 K/uL   Lymph, poc 2.1  0.6 - 3.4   POC LYMPH PERCENT 35.7  10 - 50 %L   MID (cbc) 0.5  0 - 0.9   POC MID % 8.4  0 -  12 %M   POC Granulocyte 3.3  2 - 6.9   Granulocyte percent 55.9  37 - 80 %G   RBC 4.26  4.04 - 5.48 M/uL   Hemoglobin 13.0  12.2 - 16.2 g/dL   HCT, POC 95.6  21.3 - 47.9 %   MCV 96.1  80 - 97 fL   MCH, POC 30.5  27 - 31.2 pg   MCHC 31.8  31.8 - 35.4 g/dL   RDW, POC 08.6     Platelet Count, POC 219  142 - 424 K/uL   MPV 8.8  0 - 99.8 fL  GLUCOSE, POCT (MANUAL RESULT ENTRY)      Result Value Range   POC Glucose 75  70 - 99 mg/dl       Review of Systems     Objective:   Physical Exam        Assessment & Plan:   This encounter was created in error - please disregard.

## 2012-11-02 ENCOUNTER — Ambulatory Visit: Payer: 59 | Admitting: Diagnostic Neuroimaging

## 2012-11-09 ENCOUNTER — Ambulatory Visit: Payer: 59 | Admitting: Diagnostic Neuroimaging

## 2012-12-06 ENCOUNTER — Encounter: Payer: Self-pay | Admitting: Neurology

## 2012-12-06 ENCOUNTER — Ambulatory Visit (INDEPENDENT_AMBULATORY_CARE_PROVIDER_SITE_OTHER): Payer: 59 | Admitting: Neurology

## 2012-12-06 VITALS — BP 102/71 | HR 73 | Ht 65.0 in | Wt 146.0 lb

## 2012-12-06 DIAGNOSIS — R413 Other amnesia: Secondary | ICD-10-CM | POA: Insufficient documentation

## 2012-12-06 DIAGNOSIS — F419 Anxiety disorder, unspecified: Secondary | ICD-10-CM | POA: Insufficient documentation

## 2012-12-06 DIAGNOSIS — F411 Generalized anxiety disorder: Secondary | ICD-10-CM

## 2012-12-06 NOTE — Progress Notes (Signed)
[facility]  PATIENT: Deborah Cobb DOB: 1961/05/13  HISTORICAL  Deborah Cobb is a 52 years old right-handed Caucasian female, referred by her primary care physician Dr. Chilton Si for evaluation of short-term memory trouble  She had a past medical history of anxiety, is under prolonged stress from home, she reported short-term memory trouble since April fourth 2014, she had one episode of seeing bright light in her visual field, felt exhausted afterwords, lasting for one hour, but no headaches,  Ever since then episode, she felt difficulty focusing, short-term memory trouble, has to take notes on her daily activity, she also has mild gait difficulty, veered to the right side  She has mild bifrontal headaches,  She had MRI of the brain that was normal, laboratory evaluation showed normal CBC, CMP, TSH,  She works as a Best boy for [employer].  She has history of long-standing Xanax use, was able to successfully wean herself off Xanax in June 10 2012, but in last couple weeks, because of increased anxiety, she is taking Xanax again,  REVIEW OF SYSTEMS: Full 14 system review of systems performed and notable only for weight loss, fatigue, palpation, snoring, feeling hot, allergy, memory loss, confusion, headaches, numbness, weakness, slurred speech, passing out, tremor, insomnia, sleepiness, snoring, anxiety, much sleep, decreased energy, change in appetite, disinterested in activities,  ALLERGIES: Allergies  Allergen Reactions  . Ibuprofen     angioendemia     HOME MEDICATIONS: Outpatient Prescriptions Prior to Visit  Medication Sig Dispense Refill  . FLUoxetine (PROZAC) 10 MG capsule Take 20 mg by mouth daily.      Marland Kitchen zolpidem (AMBIEN) 10 MG tablet Take 5 mg by mouth at bedtime as needed for sleep. For sleep      . chlordiazePOXIDE (LIBRIUM) 25 MG capsule Take 25 mg by mouth 4 (four) times daily as needed for anxiety. For anxiety       No  facility-administered medications prior to visit.    PAST MEDICAL HISTORY: Past Medical History  Diagnosis Date  . Anxiety   . Substance abuse   . Allergy     PAST SURGICAL HISTORY: Past Surgical History  Procedure Laterality Date  . Tubal ligation    . Cholecystectomy  06/2010    FAMILY HISTORY: Family History  Problem Relation Age of Onset  . Alcohol abuse Mother   . Heart disease Mother     A-fib/ CHF  . Alcohol abuse Father   . Atrial fibrillation Father   . COPD Father   . Myasthenia gravis Father   . Arthritis Brother   . Gout Brother   . Depression Daughter   . Drug abuse Son     SOCIAL HISTORY:  History   Social History  . Marital Status: Divorced    Spouse Name: N/A    Number of Children: 2  . Years of Education: BSN   Occupational History  . RN [employer]   Social History Main Topics  . Smoking status: Current Every Day Smoker -- 1.00 packs/day    Types: Cigarettes  . Smokeless tobacco: Not on file  . Alcohol Use: Yes     Comment: once yearly  . Drug Use: Yes    Special: Marijuana     Comment: rarely  . Sexually Active: No   Other Topics Concern  . Not on file   Social History Narrative  . No narrative on file   PHYSICAL EXAM  Filed Vitals:   12/06/12 1111  BP:  102/71  Pulse: 73  Height: 5\' 5"  (1.651 m)  Weight: 146 lb (66.225 kg)    Not recorded    Body mass index is 24.3 kg/(m^2).  Generalized: In no acute distress  Neck: Supple, no carotid bruits   Cardiac: Regular rate rhythm  Pulmonary: Clear to auscultation bilaterally  Musculoskeletal: No deformity  Neurological examination  Mentation: Alert oriented to time, place, history taking, and causual conversation, Mini-Mental Status Examination 30 out of 30  Cranial nerve II-XII: Pupils were equal round reactive to light extraocular movements were full, visual field were full on confrontational test. facial sensation and strength were normal. hearing was intact  to finger rubbing bilaterally. Uvula tongue midline.  head turning and shoulder shrug and were normal and symmetric.Tongue protrusion into cheek strength was normal.  Motor: normal tone, bulk and strength.  Sensory: Intact to fine touch, pinprick, preserved vibratory sensation, and proprioception at toes.  Coordination: Normal finger to nose, heel-to-shin bilaterally there was no truncal ataxia  Gait: Rising up from seated position without assistance, normal stance, without trunk ataxia, moderate stride, good arm swing, smooth turning, able to perform tiptoe, and heel walking without difficulty.   Romberg signs: Negative  Deep tendon reflexes: Brachioradialis 2/2, biceps 2/2, triceps 2/2, patellar 2/2, Achilles 2/2, plantar responses were flexor bilaterally.   DIAGNOSTIC DATA (LABS, IMAGING, TESTING) - I reviewed patient records, labs, notes, testing and imaging myself where available.  Lab Results  Component Value Date   WBC 5.9 09/30/2012   HGB 13.0 09/30/2012   HCT 40.9 09/30/2012   MCV 96.1 09/30/2012   PLT 215 07/05/2010      Component Value Date/Time   NA 139 09/30/2012 0951   K 4.2 09/30/2012 0951   CL 104 09/30/2012 0951   CO2 31 09/30/2012 0951   GLUCOSE 78 09/30/2012 0951   BUN 15 09/30/2012 0951   CREATININE 0.89 09/30/2012 0951   CREATININE 0.81 07/05/2010 0915   CALCIUM 9.9 09/30/2012 0951   PROT 6.5 09/30/2012 0951   ALBUMIN 4.1 09/30/2012 0951   AST 14 09/30/2012 0951   ALT 10 09/30/2012 0951   ALKPHOS 81 09/30/2012 0951   BILITOT 0.3 09/30/2012 0951   GFRNONAA >60 07/05/2010 0915   GFRAA  Value: >60        The eGFR has been calculated using the MDRD equation. This calculation has not been validated in all clinical situations. eGFR's persistently <60 mL/min signify possible Chronic Kidney Disease. 07/05/2010 0915   Lab Results  Component Value Date   TSH 1.942 09/30/2012   ASSESSMENT AND PLAN  52 years old Caucasian female, with past medical history of anxiety, presenting  with two-month history of short-term memory trouble, essentially normal neurological examination. MRI of the brain Her anxiety likely play a major role in her complains, we will proceed with neuropsychiatric evaluation continue psychiatric followup for her anxiety,,  Meds ordered this encounter  Medications  . ALPRAZolam (XANAX) 0.5 MG tablet    Sig: Take 0.5 mg by mouth 3 (three) times daily as needed for sleep.  . diphenhydrAMINE (BENADRYL) 25 MG tablet    Sig: Take 25 mg by mouth as needed for itching.     Levert Feinstein, M.D. Ph.D.  [facility] [address], [address] [place], [place] [address] [phone]

## 2013-10-29 ENCOUNTER — Ambulatory Visit: Payer: 59 | Admitting: Nurse Practitioner

## 2013-10-29 ENCOUNTER — Telehealth: Payer: Self-pay | Admitting: Nurse Practitioner

## 2013-10-29 NOTE — Telephone Encounter (Signed)
Patient was no show for today's office appointment.  

## 2013-12-21 ENCOUNTER — Emergency Department (HOSPITAL_COMMUNITY)
Admission: EM | Admit: 2013-12-21 | Discharge: 2013-12-22 | Disposition: A | Discharge status: Other Acute Inpatient Hospital | Payer: 59 | Attending: Emergency Medicine | Admitting: Emergency Medicine

## 2013-12-21 ENCOUNTER — Encounter (HOSPITAL_COMMUNITY): Payer: Self-pay | Admitting: Emergency Medicine

## 2013-12-21 DIAGNOSIS — Z733 Stress, not elsewhere classified: Secondary | ICD-10-CM | POA: Insufficient documentation

## 2013-12-21 DIAGNOSIS — F322 Major depressive disorder, single episode, severe without psychotic features: Secondary | ICD-10-CM

## 2013-12-21 DIAGNOSIS — F17200 Nicotine dependence, unspecified, uncomplicated: Secondary | ICD-10-CM | POA: Insufficient documentation

## 2013-12-21 DIAGNOSIS — F3010 Manic episode without psychotic symptoms, unspecified: Secondary | ICD-10-CM

## 2013-12-21 DIAGNOSIS — F32A Depression, unspecified: Secondary | ICD-10-CM

## 2013-12-21 DIAGNOSIS — R45851 Suicidal ideations: Secondary | ICD-10-CM

## 2013-12-21 DIAGNOSIS — F329 Major depressive disorder, single episode, unspecified: Secondary | ICD-10-CM | POA: Diagnosis present

## 2013-12-21 DIAGNOSIS — F1210 Cannabis abuse, uncomplicated: Secondary | ICD-10-CM | POA: Insufficient documentation

## 2013-12-21 DIAGNOSIS — Z8659 Personal history of other mental and behavioral disorders: Secondary | ICD-10-CM

## 2013-12-21 DIAGNOSIS — F309 Manic episode, unspecified: Secondary | ICD-10-CM | POA: Insufficient documentation

## 2013-12-21 DIAGNOSIS — F419 Anxiety disorder, unspecified: Secondary | ICD-10-CM

## 2013-12-21 DIAGNOSIS — Z79899 Other long term (current) drug therapy: Secondary | ICD-10-CM | POA: Insufficient documentation

## 2013-12-21 DIAGNOSIS — R413 Other amnesia: Secondary | ICD-10-CM

## 2013-12-21 DIAGNOSIS — F1310 Sedative, hypnotic or anxiolytic abuse, uncomplicated: Secondary | ICD-10-CM | POA: Insufficient documentation

## 2013-12-21 DIAGNOSIS — F411 Generalized anxiety disorder: Secondary | ICD-10-CM | POA: Insufficient documentation

## 2013-12-21 LAB — CBC
HCT: 42.1 % (ref 36.0–46.0)
Hemoglobin: 14.3 g/dL (ref 12.0–15.0)
MCH: 32 pg (ref 26.0–34.0)
MCHC: 34 g/dL (ref 30.0–36.0)
MCV: 94.2 fL (ref 78.0–100.0)
PLATELETS: 217 10*3/uL (ref 150–400)
RBC: 4.47 MIL/uL (ref 3.87–5.11)
RDW: 14.4 % (ref 11.5–15.5)
WBC: 8 10*3/uL (ref 4.0–10.5)

## 2013-12-21 LAB — COMPREHENSIVE METABOLIC PANEL
ALBUMIN: 3.9 g/dL (ref 3.5–5.2)
ALK PHOS: 92 U/L (ref 39–117)
ALT: 13 U/L (ref 0–35)
ANION GAP: 13 (ref 5–15)
AST: 17 U/L (ref 0–37)
BUN: 9 mg/dL (ref 6–23)
CO2: 24 mEq/L (ref 19–32)
Calcium: 10 mg/dL (ref 8.4–10.5)
Chloride: 103 mEq/L (ref 96–112)
Creatinine, Ser: 0.77 mg/dL (ref 0.50–1.10)
GFR calc Af Amer: >90 mL/min (ref >90)
GFR calc non Af Amer: >90 mL/min (ref >90)
Glucose, Bld: 92 mg/dL (ref 70–99)
POTASSIUM: 3.9 meq/L (ref 3.7–5.3)
SODIUM: 140 meq/L (ref 137–147)
TOTAL PROTEIN: 7.2 g/dL (ref 6.0–8.3)
Total Bilirubin: 0.5 mg/dL (ref 0.3–1.2)

## 2013-12-21 LAB — RAPID URINE DRUG SCREEN, HOSP PERFORMED
Amphetamines: NOT DETECTED
Barbiturates: NOT DETECTED
Benzodiazepines: POSITIVE — AB
COCAINE: NOT DETECTED
Opiates: NOT DETECTED
TETRAHYDROCANNABINOL: POSITIVE — AB

## 2013-12-21 LAB — ETHANOL: Alcohol, Ethyl (B): <11 mg/dL (ref 0–11)

## 2013-12-21 LAB — ACETAMINOPHEN LEVEL

## 2013-12-21 LAB — SALICYLATE LEVEL: Salicylate Lvl: <2 mg/dL — ABNORMAL LOW (ref 2.8–20.0)

## 2013-12-21 NOTE — ED Notes (Addendum)
Pt is a Designer, jewelleryregistered nurse, fired from [employer] 7/1 for sleeping on the job after 25 years, pt states she reported to 2nd job tonight at 7 ([age]yrs) and was confronted with Ambien and Restoril discrepancies. Pt was ask to give urine and declined because after being fired she smoked marijuana after being fired. Pt states she is having neuro s/s, altered gait, speech changes, handwriting changes. clean CT and MRI. Pt was taken of Xanax in December after 3414yrs. Pt states she has no short term memory. Pt tearful in triage. Rambling. Pt states she has not eaten in 3 days. Pt denies SI

## 2013-12-22 ENCOUNTER — Observation Stay (HOSPITAL_COMMUNITY)
Admission: AD | Admit: 2013-12-22 | Discharge: 2013-12-23 | Disposition: A | Discharge status: Home / Self Care | Payer: 59 | Source: Intra-hospital | Attending: Psychiatry | Admitting: Psychiatry

## 2013-12-22 ENCOUNTER — Encounter (HOSPITAL_COMMUNITY): Payer: Self-pay | Admitting: *Deleted

## 2013-12-22 DIAGNOSIS — F3289 Other specified depressive episodes: Secondary | ICD-10-CM

## 2013-12-22 DIAGNOSIS — F329 Major depressive disorder, single episode, unspecified: Secondary | ICD-10-CM

## 2013-12-22 DIAGNOSIS — R45851 Suicidal ideations: Secondary | ICD-10-CM

## 2013-12-22 DIAGNOSIS — F319 Bipolar disorder, unspecified: Secondary | ICD-10-CM | POA: Insufficient documentation

## 2013-12-22 DIAGNOSIS — F1210 Cannabis abuse, uncomplicated: Secondary | ICD-10-CM | POA: Insufficient documentation

## 2013-12-22 DIAGNOSIS — F17200 Nicotine dependence, unspecified, uncomplicated: Secondary | ICD-10-CM | POA: Insufficient documentation

## 2013-12-22 DIAGNOSIS — F332 Major depressive disorder, recurrent severe without psychotic features: Principal | ICD-10-CM | POA: Insufficient documentation

## 2013-12-22 DIAGNOSIS — F1010 Alcohol abuse, uncomplicated: Secondary | ICD-10-CM | POA: Insufficient documentation

## 2013-12-22 DIAGNOSIS — F411 Generalized anxiety disorder: Secondary | ICD-10-CM | POA: Insufficient documentation

## 2013-12-22 HISTORY — DX: Bipolar disorder, unspecified: F31.9

## 2013-12-22 HISTORY — DX: Headache: R51

## 2013-12-22 LAB — URINALYSIS, ROUTINE W REFLEX MICROSCOPIC
Bilirubin Urine: NEGATIVE
Glucose, UA: NEGATIVE mg/dL
Ketones, ur: NEGATIVE mg/dL
Leukocytes, UA: NEGATIVE
Nitrite: NEGATIVE
PH: 6 (ref 5.0–8.0)
Protein, ur: NEGATIVE mg/dL
SPECIFIC GRAVITY, URINE: 1.007 (ref 1.005–1.030)
Urobilinogen, UA: 0.2 mg/dL (ref 0.0–1.0)

## 2013-12-22 LAB — URINE MICROSCOPIC-ADD ON

## 2013-12-22 LAB — TSH: TSH: 2.08 u[IU]/mL (ref 0.350–4.500)

## 2013-12-22 MED ORDER — FLUOXETINE HCL 20 MG PO CAPS
40.0000 mg | ORAL_CAPSULE | Freq: Every day | ORAL | Status: DC
  Filled 2013-12-22: qty 2

## 2013-12-22 MED ORDER — ONDANSETRON HCL 4 MG PO TABS: 4.0000 mg | ORAL_TABLET | Freq: Three times a day (TID) | ORAL | Status: DC | PRN

## 2013-12-22 MED ORDER — ZOLPIDEM TARTRATE 5 MG PO TABS: 5.0000 mg | ORAL_TABLET | Freq: Every evening | ORAL | Status: DC | PRN

## 2013-12-22 MED ORDER — GABAPENTIN 300 MG PO CAPS
300.0000 mg | ORAL_CAPSULE | ORAL | Status: DC | PRN
  Filled 2013-12-22: qty 1

## 2013-12-22 MED ORDER — ALUM & MAG HYDROXIDE-SIMETH 200-200-20 MG/5ML PO SUSP: 30.0000 mL | ORAL | Status: DC | PRN

## 2013-12-22 MED ORDER — MAGNESIUM HYDROXIDE 400 MG/5ML PO SUSP: 30.0000 mL | Freq: Every day | ORAL | Status: DC | PRN

## 2013-12-22 MED ORDER — CHLORDIAZEPOXIDE HCL 25 MG PO CAPS
25.0000 mg | ORAL_CAPSULE | Freq: Once | ORAL | Status: AC
  Administered 2013-12-22: 25 mg via ORAL

## 2013-12-22 MED ORDER — CHLORDIAZEPOXIDE HCL 25 MG PO CAPS
ORAL_CAPSULE | ORAL | Status: AC
  Filled 2013-12-22: qty 1

## 2013-12-22 MED ORDER — NICOTINE 21 MG/24HR TD PT24: 21.0000 mg | MEDICATED_PATCH | Freq: Every day | TRANSDERMAL | Status: DC

## 2013-12-22 MED ORDER — ZOLPIDEM TARTRATE 5 MG PO TABS
5.0000 mg | ORAL_TABLET | Freq: Every evening | ORAL | Status: DC | PRN
  Administered 2013-12-22: 5 mg via ORAL
  Filled 2013-12-22: qty 1

## 2013-12-22 MED ORDER — ACETAMINOPHEN 325 MG PO TABS: 650.0000 mg | ORAL_TABLET | ORAL | Status: DC | PRN

## 2013-12-22 MED ORDER — GABAPENTIN 300 MG PO CAPS: 300.0000 mg | ORAL_CAPSULE | ORAL | Status: DC | PRN

## 2013-12-22 MED ORDER — HYDROXYZINE HCL 25 MG PO TABS
25.0000 mg | ORAL_TABLET | Freq: Three times a day (TID) | ORAL | Status: DC | PRN
  Administered 2013-12-22 – 2013-12-23 (×2): 25 mg via ORAL
  Filled 2013-12-22 (×2): qty 1

## 2013-12-22 MED ORDER — LORAZEPAM 1 MG PO TABS
1.0000 mg | ORAL_TABLET | Freq: Once | ORAL | Status: AC
  Administered 2013-12-22: 1 mg via ORAL
  Filled 2013-12-22: qty 1

## 2013-12-22 MED ORDER — NICOTINE 21 MG/24HR TD PT24
21.0000 mg | MEDICATED_PATCH | Freq: Every day | TRANSDERMAL | Status: DC
  Administered 2013-12-22 – 2013-12-23 (×2): 21 mg via TRANSDERMAL
  Filled 2013-12-22 (×3): qty 1

## 2013-12-22 MED ORDER — ONDANSETRON HCL 4 MG PO TABS
4.0000 mg | ORAL_TABLET | Freq: Three times a day (TID) | ORAL | Status: DC | PRN
Start: 2013-12-22 — End: 2013-12-22

## 2013-12-22 MED ORDER — ALUM & MAG HYDROXIDE-SIMETH 200-200-20 MG/5ML PO SUSP
30.0000 mL | ORAL | Status: DC | PRN
Start: 2013-12-22 — End: 2013-12-22

## 2013-12-22 MED ORDER — FLUOXETINE HCL 20 MG PO CAPS
40.0000 mg | ORAL_CAPSULE | Freq: Every day | ORAL | Status: DC
  Administered 2013-12-22 – 2013-12-23 (×2): 40 mg via ORAL
  Filled 2013-12-22 (×5): qty 2

## 2013-12-22 NOTE — ED Notes (Signed)
Pt upset because she signed a form saying she was voluntarily accepting help, but she states she doesn't want help - she has a plan for help at home and people at home who love her.  Pt encouraged to accept that she will be admitted for observation.

## 2013-12-22 NOTE — Progress Notes (Signed)
Patient ID: Deborah CowerBrenda D Cobb, female   DOB: 02/18/1961, 53 y.o.   MRN: [id number] Pt alert and oriented. Denies SI/HI, -A/V hall. Verbally contracts for safety. Denies pain or discomfort. Pt tearful and upset. States should not be here. "things were taken the wrong way" " I got fired last night and just wanted someone to talk to . Will monitor closely and evaluate for stabilization.
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
## 2013-12-22 NOTE — ED Notes (Signed)
[facility] at this [facility].

## 2013-12-22 NOTE — ED Notes (Signed)
Pt ate 50% of her lunch

## 2013-12-22 NOTE — ED Notes (Signed)
Pt [place] one belongings [place] with [id number]

## 2013-12-22 NOTE — ED Notes (Signed)
Pt is tearful on exam.  Pt states, "I don't want to kill myself."  Pt states she was terminated from [facility] [employer] on 12/11/2013 after falling alseep during a meeting.  Pt states she had worked night shift the night before and was sleepy.  Pt was terminated yesterday from [facility] after being accused of diverting meds.  Pt denies doing this and states she has an Rx for one of the meds.  [facility] process explained to patient.

## 2013-12-22 NOTE — ED Notes (Signed)
Pt ate 50

## 2013-12-22 NOTE — ED Notes (Signed)
Pt does not want to be awoken for VS

## 2013-12-22 NOTE — ED Notes (Signed)
Pt very tearful.  Cannot stop crying in room.

## 2013-12-22 NOTE — Progress Notes (Signed)
Patient was recommended for observation unit by Dr. Tawni CarnesSaranga and accepted to Obs-2 per [facility]erv[facility]Eric[facility].  Patient's bed will be available after 2pm for transfer.    Maryelizabeth Rowanressa Raffi Milstein, MSW, LCSWA,12/22/2013 Evening Clinical Social Worker [phone]

## 2013-12-22 NOTE — Progress Notes (Signed)
Patient ID: Deborah Cobb, female   DOB: 01/10/1961, 53 y.o.   MRN: [id number] Nursing admission note:  Patient is a 53 yo female that [facility] with complaints of neuro changes, confusion and behavior changes.  Patient has been experiencing short term memory loss.  Patient has two jobs with two weeks of each other.  She lives at home with her two adult children.  Patient was recently fired from her job of 25 years.  She reports depressive symptoms with decreased concentration, insomnia and decreased appetite.  She denies any SI/HI/AVH.  Patient states, "I feel like I am in the middle of an [place] and my head is spinning.  Patient denies any prior hospitalizations.  She sees Dr. Evelene CroonKaur on a regular basis.  Patient is pleasant with good eye contact.  Patient was oriented to obs unit and given nutrition.

## 2013-12-22 NOTE — ED Notes (Signed)
Pt asked to talk to this writer to "clarify something".  Asked if she was going to see a psychiatrist today or if it was just a Veterinary surgeoncounselor.  I assured her that there is a psychiatrist here today.  Pt states that she is going to "tell them what they want to hear to get the hell out of here.  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  Pt states, "Am I depressed? Yes. Am I lonely? Yes.  Am I going through a major life crisis? Yes But I'm not going to tell them that because I want out of here"
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
## 2013-12-22 NOTE — ED Notes (Signed)
Pt requesting librium.  Order received from Dr. Elwin MochaBlair Walden for 1 mg ativan.

## 2013-12-22 NOTE — ED Notes (Signed)
Patient ate 50

## 2013-12-22 NOTE — BH Assessment (Signed)
Assessment completed. Psychiatric evaluation has been recommended. Dr. Norlene Campbelltter has been notified of the recommendation.

## 2013-12-22 NOTE — ED Notes (Signed)
Went to check on pt, pt stated she was ready to leave that she could not listen to the pt who is laughing loud next door, stated she hates this place because they fired her and she can't look at anything with [place] on it- advised pt I would check her VS and let the nurse know- pt stated she just wants to go home and be with her cat and dog- RN notified.

## 2013-12-22 NOTE — ED Notes (Signed)
2 patient belonging bags [facility] [employer].  Bags included cell phone and blue purse.

## 2013-12-22 NOTE — ED Notes (Signed)
[facility]

## 2013-12-22 NOTE — ED Notes (Signed)
Went in to speak with pt with Silvestre GunnerLaurie AC to verify if pt is willing to stay voluntarily.  Pt states that if she is being told that she will be made involuntary if she left that she is being IVC'd.  Per Silvestre GunnerLaurie AC, pt called her direct number and made a complaint re her being held in the ED against her will.  [place] EDP states that she had a talk with the pt re same and had agreed at the time to stay.
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
## 2013-12-23 LAB — URINE CULTURE
Colony Count: NO GROWTH
Culture: NO GROWTH

## 2013-12-23 MED ORDER — FLUOXETINE HCL 40 MG PO CAPS: 40.0000 mg | ORAL_CAPSULE | Freq: Every day | ORAL | Status: DC

## 2013-12-23 MED ORDER — GABAPENTIN 600 MG PO TABS: 300.0000 mg | ORAL_TABLET | ORAL | Status: DC | PRN

## 2013-12-23 MED ORDER — CHLORDIAZEPOXIDE HCL 25 MG PO CAPS: 25.0000 mg | ORAL_CAPSULE | Freq: Two times a day (BID) | ORAL | Status: DC
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
## 2013-12-23 NOTE — H&P (Signed)
Patient seen, evaluated by me, treatment plan formulated by me. 
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
## 2013-12-23 NOTE — Progress Notes (Signed)
[facility] INPATIENT:  Family/Significant Other Suicide Prevention Education  Suicide Prevention Education:  Patient Refusal for Family/Significant Other Suicide Prevention Education: The patient Deborah Cobb has refused to provide written consent for family/significant other to be provided Family/Significant Other Suicide Prevention Education during admission and/or prior to discharge.  Physician notified.  [name] 12/23/2013, 2:18 PM

## 2013-12-23 NOTE — Discharge Instructions (Addendum)
You are scheduled to start the Mental Health Intensive Outpatient Program (MH-IOP) at [facility] on Wednesday, 12/25/2013 at 9:00am.  The Observation Unit staff has given you a packet of intake paperwork.  Fill it out at your convenience and take it with you for the first session.  [facility] [address] [place], [place] [address] Contact person: Jeri ModenaRita Clark, Kaylyn LayerMEd [phone]  If you have a behavioral health crisis you have several options, all of which are available 24 hours a day, 7 days a week:  *Call Mobile Crisis and a clinician will come to you: [phone] *Call 911 *Go to the [facility] at [address], [place], [place] *Go to your local hospital emergency department

## 2013-12-23 NOTE — Discharge Summary (Signed)
Patient seen, evaluated by me. Patient can be discharged home. Patient contracts for safety and has outpatient followup

## 2013-12-23 NOTE — Plan of Care (Signed)
[facility] Observation Crisis Plan  Reason for Crisis Plan:  Crisis Stabilization   Plan of Care:  Referral for IOP  Family Support:    Pt reports that she has a sister in [place] and a close friend that lives locally; both are unnamed by the pt.  Current Living Environment:  Living Arrangements: Other relatives; Pt lives with her 2 adult children  Insurance:  [facility] Account   Name Acct ID Class Status Primary Coverage   Deborah Cobb, Deborah Cobb [id number] BEHAVIORAL HEALTH OBSERVATION [employer]        Guarantor Account (for Hospital Account [id number])   Name Relation to Pt Service Area Active? Acct Type   Deborah Cobb, Deborah Cobb Self [facility] Yes [facility]   Address Phone       [address] [place], [place] [address] [phone](H) [phone])          Coverage Information (for Hospital Account [id number])   F/O Payor/Plan Precert #   [employer] [place] [employer]    Subscriber Subscriber #   Deborah Cobb, Deborah Cobb [id number]   Address Phone   [address] [place], [place] [address] [phone]      Legal Guardian:   Self  Primary Care Provider:  Shade FloodGREENE,JEFFREY R, MD; pt plans to change primary care services to an Internal Medicine Specialist  Current Outpatient Providers:  Milagros Evenerupinder Kaur, MD - pt is dissatisfied with her services and refuses to sign Consent to Release Information.  Psychiatrist:    Milagros Evenerupinder Kaur, MD  Counselor/Therapist:   None  Compliant with Medications:  Yes  Additional Information: After consulting with Claudette Headonrad Withrow, NP it has been determined that pt does not present a life threatening danger to herself or others, and that psychiatric hospitalization is not indicated for her at this time.  She is interested in enrolling in MH-IOP, which Renata CapriceConrad believes to be appropriate for her current needs.  At 14:30 I spoke to Owens-Illinoisita Clark. She has scheduled pt for intake in the program on Wednesday 12/25/2013  at 09:00.  This information will be included in pt's discharge instructions.  [name], MA Triage Specialist Raphael GibneyHughes, Jayley Hustead Patrick 7/13/20153:00 PM

## 2013-12-23 NOTE — Progress Notes (Signed)
Patient discharged with all belongings, AVS reviewed. Suicide prevention information reviewed and understanding verbalized. Patient states that she has a supportive mother that she can call when she gets home. Patient's affect bright and voiced positive thoughts.

## 2013-12-25 ENCOUNTER — Other Ambulatory Visit (HOSPITAL_COMMUNITY): Payer: 59 | Attending: Psychiatry | Admitting: Psychiatry

## 2013-12-25 ENCOUNTER — Encounter (HOSPITAL_COMMUNITY): Payer: Self-pay

## 2013-12-25 DIAGNOSIS — Z5987 Material hardship due to limited financial resources, not elsewhere classified: Secondary | ICD-10-CM | POA: Insufficient documentation

## 2013-12-25 DIAGNOSIS — F3110 Bipolar disorder, current episode manic without psychotic features, unspecified: Secondary | ICD-10-CM

## 2013-12-25 DIAGNOSIS — F411 Generalized anxiety disorder: Secondary | ICD-10-CM | POA: Insufficient documentation

## 2013-12-25 DIAGNOSIS — F332 Major depressive disorder, recurrent severe without psychotic features: Secondary | ICD-10-CM | POA: Insufficient documentation

## 2013-12-25 DIAGNOSIS — Z609 Problem related to social environment, unspecified: Secondary | ICD-10-CM | POA: Diagnosis not present

## 2013-12-25 DIAGNOSIS — G4700 Insomnia, unspecified: Secondary | ICD-10-CM | POA: Diagnosis not present

## 2013-12-25 DIAGNOSIS — Z9089 Acquired absence of other organs: Secondary | ICD-10-CM | POA: Insufficient documentation

## 2013-12-25 DIAGNOSIS — Z598 Other problems related to housing and economic circumstances: Secondary | ICD-10-CM | POA: Insufficient documentation

## 2013-12-25 MED ORDER — CHLORPROMAZINE HCL 50 MG PO TABS: 50.0000 mg | ORAL_TABLET | Freq: Every day | ORAL | Status: DC
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
## 2013-12-26 ENCOUNTER — Other Ambulatory Visit (HOSPITAL_COMMUNITY): Payer: 59 | Admitting: Psychiatry

## 2013-12-26 ENCOUNTER — Encounter (HOSPITAL_COMMUNITY): Payer: Self-pay | Admitting: Psychiatry

## 2013-12-26 DIAGNOSIS — F3110 Bipolar disorder, current episode manic without psychotic features, unspecified: Secondary | ICD-10-CM

## 2013-12-26 DIAGNOSIS — F332 Major depressive disorder, recurrent severe without psychotic features: Secondary | ICD-10-CM | POA: Diagnosis not present
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
## 2013-12-26 NOTE — Progress Notes (Signed)
    Daily Group Progress Note  Program: IOP  Group Time: 9:00-10:30  Participation Level: Active  Behavioral Response: Appropriate  Type of Therapy:  Group Therapy  Summary of Progress: Pt. Met with case manager and psychiatrist.     Group Time: 10:30-12:00  Participation Level:  Active  Behavioral Response: Appropriate  Type of Therapy: Psycho-education Group  Summary of Progress: Pt. Met with case manager and psychiatrist.  [facility]

## 2013-12-27 ENCOUNTER — Other Ambulatory Visit (HOSPITAL_COMMUNITY): Payer: 59
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
## 2013-12-27 NOTE — Progress Notes (Signed)
Patient ID: Deborah Cobb, female   DOB: 06/29/1960, 53 y.o.   MRN: [id number] Pt seens tates doing well, sleep , appetite is good. States Thorazine helps her sleep thru the night , discussed dc Benadryl pt stated understanding. No SI/ HI. tol meds well No hallu/ delusions.

## 2013-12-27 NOTE — Progress Notes (Signed)
    Daily Group Progress Note  Program: IOP  Group Time: 0900-1030  Participation Level: Active  Behavioral Response: Sharing and Rigid  Type of Therapy:  Process Group  Summary of Progress: Patient discussed how she is grieving the loss of her jobs.  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  Patient also discussed at length how difficult it is for her to set boundaries with her family, especially her kids.  States she has the tendency to give them whatever they want.  Whenever her peers mentioned how her daughter needed to have limits set, pt became very defensive.     Group Time: 1045-1200  Participation Level:  Active  Behavioral Response: Appropriate  Type of Therapy: Psycho-education Group  Summary of Progress: Went for a walk at a nearby park.  Learned breath work, guided imagery and progressive relaxation techniques. Practiced ways to breathe, tense and relax muscles through guiding the mind and body connection.

## 2013-12-30 ENCOUNTER — Other Ambulatory Visit (HOSPITAL_COMMUNITY): Payer: 59 | Admitting: Psychiatry

## 2013-12-31 ENCOUNTER — Other Ambulatory Visit (HOSPITAL_COMMUNITY): Payer: 59 | Admitting: Psychiatry

## 2014-01-01 ENCOUNTER — Encounter (HOSPITAL_COMMUNITY): Payer: Self-pay | Admitting: Psychiatry

## 2014-01-01 ENCOUNTER — Other Ambulatory Visit (HOSPITAL_COMMUNITY): Payer: 59 | Admitting: Psychiatry

## 2014-01-01 DIAGNOSIS — F3110 Bipolar disorder, current episode manic without psychotic features, unspecified: Secondary | ICD-10-CM

## 2014-01-01 DIAGNOSIS — F332 Major depressive disorder, recurrent severe without psychotic features: Secondary | ICD-10-CM | POA: Diagnosis not present

## 2014-01-01 NOTE — Progress Notes (Signed)
    Daily Group Progress Note  Program: IOP  Group Time: 9:00-10:30  Participation Level: Active  Behavioral Response: Appropriate  Type of Therapy:  Group Therapy  Summary of Progress: Pt. Discussed anticipatory grief related to relationship with her mother, pattern of excessive caregiving of adult children and feeling disrespected by her children. Pt. Continuing process of grieving loss of employment.     Group Time: 10:30-12:00  Participation Level:  Active  Behavioral Response: Appropriate  Type of Therapy: Psycho-education Group  Summary of Progress: Continued with discussion about shame resilience and developing healthy vulnerability in relationships.  [facility]

## 2014-01-02 ENCOUNTER — Encounter (HOSPITAL_COMMUNITY): Payer: Self-pay | Admitting: Psychiatry

## 2014-01-02 ENCOUNTER — Other Ambulatory Visit (HOSPITAL_COMMUNITY): Payer: 59 | Admitting: Psychiatry

## 2014-01-02 DIAGNOSIS — F332 Major depressive disorder, recurrent severe without psychotic features: Secondary | ICD-10-CM | POA: Diagnosis not present

## 2014-01-02 NOTE — Progress Notes (Signed)
    Daily Group Progress Note  Program: IOP  Group Time: 9:00-10:30  Participation Level: Active  Behavioral Response: Appropriate  Type of Therapy:  Group Therapy  Summary of Progress: Pt. Reported that her mood is improving and feeling less manic and more balanced. Pt. Reported that sleep was not a problem. Session focused on identifying strengths i.e., caring, being a helper, strong work [employer].     Group Time: 10:30-12:00  Participation Level:  Active  Behavioral Response: Appropriate  Type of Therapy: Psycho-education Group  Summary of Progress: Pt. Participated in discussion about development of self-acceptance.  Shaune PollackBrown, Jennifer B, COUNS

## 2014-01-03 ENCOUNTER — Other Ambulatory Visit (HOSPITAL_COMMUNITY): Payer: 59 | Admitting: Psychiatry

## 2014-01-03 ENCOUNTER — Encounter (HOSPITAL_COMMUNITY): Payer: Self-pay | Admitting: Psychiatry

## 2014-01-03 DIAGNOSIS — F332 Major depressive disorder, recurrent severe without psychotic features: Secondary | ICD-10-CM | POA: Diagnosis not present

## 2014-01-03 DIAGNOSIS — F3110 Bipolar disorder, current episode manic without psychotic features, unspecified: Secondary | ICD-10-CM

## 2014-01-03 NOTE — Progress Notes (Signed)
    Daily Group Progress Note  Program: IOP  Group Time: 9:00-10:30  Participation Level: Active  Behavioral Response: Appropriate  Type of Therapy:  Group Therapy  Summary of Progress: Pt. Reports that she continues to wake up with anxiety but was able to calm herself by walking her dog. Pt. Reports that she is feeling "pretty good", continues to work on boundary setting in relationships with adult children, and self-punishing behaviors related to exercise and food deprivation.     Group Time: 10:30-12:00  Participation Level:  Active  Behavioral Response: Appropriate  Type of Therapy: Psycho-education Group  Summary of Progress: Pt. Participated in discussion about self-compassion; watched Clovia CuffKristin Neff video.  Shaune PollackBrown, Jennifer B, COUNS

## 2014-01-06 ENCOUNTER — Other Ambulatory Visit (HOSPITAL_COMMUNITY): Payer: 59

## 2014-01-06 ENCOUNTER — Encounter (HOSPITAL_COMMUNITY): Payer: Self-pay | Admitting: Psychiatry

## 2014-01-06 ENCOUNTER — Other Ambulatory Visit (HOSPITAL_COMMUNITY): Payer: 59 | Admitting: Psychiatry

## 2014-01-06 DIAGNOSIS — F3110 Bipolar disorder, current episode manic without psychotic features, unspecified: Secondary | ICD-10-CM

## 2014-01-06 DIAGNOSIS — F332 Major depressive disorder, recurrent severe without psychotic features: Secondary | ICD-10-CM | POA: Diagnosis not present

## 2014-01-06 NOTE — Progress Notes (Signed)
    Daily Group Progress Note  Program: IOP  Group Time: 9:00-10:30  Participation Level: Active  Behavioral Response: Appropriate  Type of Therapy:  Group Therapy  Summary of Progress: Pt. Presents with brightened affect, smiles and laughs appropriately. Pt. Reported that she felt manic over the weekend which was noticed by family members who observed that she was talking a lot. 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 Pt. Complained of low blood sugar and was given lemonade, peanut butter and crackers from adult unit.     Group Time: 10:30-12:00  Participation Level:  Active  Behavioral Response: Appropriate  Type of Therapy: Psycho-education Group  Summary of Progress: Pt. Participated in grief and loss group facilitated by Theda BelfastBob Hamilton.  Shaune PollackBrown, Kaiden Pech B, COUNS

## 2014-01-07 ENCOUNTER — Other Ambulatory Visit (HOSPITAL_COMMUNITY): Payer: 59 | Admitting: Psychiatry

## 2014-01-07 ENCOUNTER — Encounter (HOSPITAL_COMMUNITY): Payer: Self-pay | Admitting: Psychiatry

## 2014-01-07 DIAGNOSIS — F3110 Bipolar disorder, current episode manic without psychotic features, unspecified: Secondary | ICD-10-CM

## 2014-01-07 DIAGNOSIS — F332 Major depressive disorder, recurrent severe without psychotic features: Secondary | ICD-10-CM | POA: Diagnosis not present

## 2014-01-07 NOTE — Progress Notes (Signed)
    Daily Group Progress Note  Program: IOP  Group Time: 9:00-10:30  Participation Level: Active  Behavioral Response: Appropriate  Type of Therapy:  Group Therapy  Summary of Progress: Pt. Reported that she was doing "ok". Pt. Spent most of session decision about her career. Pt. Is open and receptive to feedback; provides appropriate feedback to others.     Group Time: 10:30-12:00  Participation Level:  Active  Behavioral Response: Appropriate  Type of Therapy: Psycho-education Group  Summary of Progress: Pt. Participated in discussion about altering physical stance in order to affect mood; watched Amy Cuddy video.  Shaune PollackBrown, Jarmon Javid B, COUNS

## 2014-01-08 ENCOUNTER — Other Ambulatory Visit (HOSPITAL_COMMUNITY): Payer: 59 | Admitting: Psychiatry

## 2014-01-08 ENCOUNTER — Encounter (HOSPITAL_COMMUNITY): Payer: Self-pay | Admitting: Psychiatry

## 2014-01-08 DIAGNOSIS — F332 Major depressive disorder, recurrent severe without psychotic features: Secondary | ICD-10-CM | POA: Diagnosis not present

## 2014-01-08 DIAGNOSIS — F3110 Bipolar disorder, current episode manic without psychotic features, unspecified: Secondary | ICD-10-CM

## 2014-01-08 NOTE — Progress Notes (Signed)
    Daily Group Progress Note  Program: IOP  Group Time: 9:00-10:30  Participation Level: Active  Behavioral Response: Appropriate  Type of Therapy:  Group Therapy  Summary of Progress: Pt. 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 Pt. Reported anxiety related to employment concerns.     Group Time: 10:30-12:00  Participation Level:  Active  Behavioral Response: Appropriate  Type of Therapy: Psycho-education Group  Summary of Progress: Pt. Participated in discussion about developing and enforcing healthy relationship boundaries.  Shaune PollackBrown, Taresa Montville B, COUNS

## 2014-01-09 ENCOUNTER — Other Ambulatory Visit (HOSPITAL_COMMUNITY): Payer: 59 | Admitting: Psychiatry

## 2014-01-09 DIAGNOSIS — F332 Major depressive disorder, recurrent severe without psychotic features: Secondary | ICD-10-CM | POA: Diagnosis not present

## 2014-01-09 DIAGNOSIS — F3110 Bipolar disorder, current episode manic without psychotic features, unspecified: Secondary | ICD-10-CM

## 2014-01-09 NOTE — Progress Notes (Signed)
    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 Patient discussed that she feels "better" about moving forward now that she has made her decision. Patient discussed being cautious about moving forward in her career and making a decision to accept a new job during the investigation. Patient reported that she was feeling very "hopeful" and "optimistic" about moving forward.      Group Time: 11:00-12:00  Participation Level:  Active  Behavioral Response: Appropriate and Sharing  Type of Therapy: Psycho-education Group  Summary of Progress: Group defined and discussed the definitions of Cognitive Distortions. Group Discussed the Cognitive Distortions in which they identified with the most. Group identified strategies to change distorted thinking and discussed ways to implement the various strategies.  Patient identified her Cognitive Distortions as "All or Nothing" and identified the strategy of "Identify the Distortion" as her goal to overcome this distortion.

## 2014-01-10 ENCOUNTER — Other Ambulatory Visit (HOSPITAL_COMMUNITY): Payer: 59 | Admitting: Psychiatry

## 2014-01-10 DIAGNOSIS — F3110 Bipolar disorder, current episode manic without psychotic features, unspecified: Secondary | ICD-10-CM

## 2014-01-10 DIAGNOSIS — F332 Major depressive disorder, recurrent severe without psychotic features: Secondary | ICD-10-CM | POA: Diagnosis not present

## 2014-01-10 MED ORDER — FLUOXETINE HCL 40 MG PO CAPS: 40.0000 mg | ORAL_CAPSULE | Freq: Every day | ORAL | Status: DC

## 2014-01-10 MED ORDER — ZOLPIDEM TARTRATE 5 MG PO TABS: 5.0000 mg | ORAL_TABLET | Freq: Every day | ORAL | Status: DC

## 2014-01-10 MED ORDER — CHLORPROMAZINE HCL 100 MG PO TABS: 100.0000 mg | ORAL_TABLET | Freq: Every day | ORAL | Status: DC
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
## 2014-01-10 NOTE — Patient Instructions (Signed)
Patient completed MH-IOP today.  Will follow up with Dr. Lolly MustacheArfeen on 01-22-15 @ 1:15 pm and [facility] @ 2:30pm.  Encouraged support groups and [employer].

## 2014-01-10 NOTE — Progress Notes (Signed)
    Daily Group Progress Note  Program: IOP  Group Time:0900-1030   Participation Level: Active  Behavioral Response: Appropriate  Type of Therapy:  Group Therapy  Summary of Progress: Today was patient's last day in MH-IOP.  States she has learned how to practice self care.  Plans to attend [employer].  Reports that groups work better for her. Pt is able to cope better with stressors.  Setting limits better.     Group Time: 1045-1200  Participation Level:  Active  Behavioral Response: Appropriate  Type of Therapy: Psycho-education Group  Summary of Progress:  Discussed how stress can affect the body and explored fifteen ways to beat stress. Practiced deep breathing, guided imagery, and progressive relaxation.
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
## 2014-01-13 ENCOUNTER — Other Ambulatory Visit (HOSPITAL_COMMUNITY): Payer: 59

## 2014-01-14 ENCOUNTER — Other Ambulatory Visit (HOSPITAL_COMMUNITY): Payer: 59

## 2014-01-15 ENCOUNTER — Other Ambulatory Visit (HOSPITAL_COMMUNITY): Payer: 59

## 2014-01-16 ENCOUNTER — Other Ambulatory Visit (HOSPITAL_COMMUNITY): Payer: 59

## 2014-01-17 ENCOUNTER — Other Ambulatory Visit (HOSPITAL_COMMUNITY): Payer: 59

## 2014-01-20 ENCOUNTER — Encounter (HOSPITAL_COMMUNITY): Payer: Self-pay | Admitting: Psychiatry

## 2014-01-20 ENCOUNTER — Other Ambulatory Visit (HOSPITAL_COMMUNITY): Payer: 59

## 2014-01-21 ENCOUNTER — Ambulatory Visit (INDEPENDENT_AMBULATORY_CARE_PROVIDER_SITE_OTHER): Payer: Self-pay | Admitting: Psychiatry

## 2014-01-21 ENCOUNTER — Encounter (HOSPITAL_COMMUNITY): Payer: Self-pay | Admitting: Psychiatry

## 2014-01-21 ENCOUNTER — Other Ambulatory Visit (HOSPITAL_COMMUNITY): Payer: 59

## 2014-01-21 VITALS — BP 131/87 | HR 71 | Wt 143.0 lb

## 2014-01-21 DIAGNOSIS — F321 Major depressive disorder, single episode, moderate: Secondary | ICD-10-CM

## 2014-01-21 DIAGNOSIS — F339 Major depressive disorder, recurrent, unspecified: Secondary | ICD-10-CM

## 2014-01-21 DIAGNOSIS — F1210 Cannabis abuse, uncomplicated: Secondary | ICD-10-CM

## 2014-01-21 MED ORDER — HYDROXYZINE PAMOATE 25 MG PO CAPS: 25.0000 mg | ORAL_CAPSULE | Freq: Two times a day (BID) | ORAL | Status: DC | PRN

## 2014-01-21 MED ORDER — QUETIAPINE FUMARATE 100 MG PO TABS: ORAL_TABLET | ORAL | Status: DC
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
## 2014-01-22 ENCOUNTER — Other Ambulatory Visit (HOSPITAL_COMMUNITY): Payer: 59

## 2014-01-23 ENCOUNTER — Other Ambulatory Visit (HOSPITAL_COMMUNITY): Payer: 59

## 2014-01-24 ENCOUNTER — Other Ambulatory Visit (HOSPITAL_COMMUNITY): Payer: 59

## 2014-01-24 NOTE — Progress Notes (Signed)
   Daily Group Progress Note  Program: IOP  Group Time: 9:00 -10:45   Participation Level: Active   Behavioral Response: Appropriate, Sharing and Motivated   Type of Therapy: Group Therapy   Summary of Progress: Patient checked in as feeling "anxious" and "catious." Patient discussed how she recently heard from the nursing board and decided to move forward with the investigation. Patient discussed that she feels "better" about moving forward now that she has made her decision. Patient discussed being cautious about moving forward in her career and making a decision to accept a new job during the investigation. Patient reported that she was feeling very "hopeful" and "optimistic" about moving forward.   Group Time: 11:00-12:00   Participation Level: Active   Behavioral Response: Appropriate and Sharing   Type of Therapy: Psycho-education Group   Summary of Progress: Group defined and discussed the definitions of Cognitive Distortions. Group Discussed the Cognitive Distortions in which they identified with the most. Group identified strategies to change distorted thinking and discussed ways to implement the various strategies. Patient identified her Cognitive Distortions as "All or Nothing" and identified the strategy of "Identify the Distortion" as her goal to overcome this distortion.       Shaune PollackBrown, Jennifer B, COUNS

## 2014-01-27 ENCOUNTER — Other Ambulatory Visit (HOSPITAL_COMMUNITY): Payer: 59

## 2014-01-28 ENCOUNTER — Other Ambulatory Visit (HOSPITAL_COMMUNITY): Payer: 59

## 2014-01-29 ENCOUNTER — Other Ambulatory Visit (HOSPITAL_COMMUNITY): Payer: 59

## 2014-01-30 ENCOUNTER — Other Ambulatory Visit (HOSPITAL_COMMUNITY): Payer: 59

## 2014-01-31 ENCOUNTER — Ambulatory Visit (HOSPITAL_COMMUNITY): Payer: Self-pay | Admitting: Psychology

## 2014-01-31 ENCOUNTER — Other Ambulatory Visit (HOSPITAL_COMMUNITY): Payer: 59

## 2014-02-02 ENCOUNTER — Other Ambulatory Visit (HOSPITAL_COMMUNITY): Payer: Self-pay | Admitting: Psychiatry

## 2014-02-03 ENCOUNTER — Other Ambulatory Visit (HOSPITAL_COMMUNITY): Payer: 59

## 2014-02-04 ENCOUNTER — Other Ambulatory Visit (HOSPITAL_COMMUNITY): Payer: Self-pay | Admitting: Psychiatry

## 2014-02-04 ENCOUNTER — Other Ambulatory Visit (HOSPITAL_COMMUNITY): Payer: 59

## 2014-02-04 DIAGNOSIS — F321 Major depressive disorder, single episode, moderate: Secondary | ICD-10-CM

## 2014-02-04 MED ORDER — HYDROXYZINE PAMOATE 25 MG PO CAPS: 25.0000 mg | ORAL_CAPSULE | Freq: Two times a day (BID) | ORAL | Status: DC | PRN

## 2014-02-12 ENCOUNTER — Ambulatory Visit (INDEPENDENT_AMBULATORY_CARE_PROVIDER_SITE_OTHER): Payer: Self-pay | Admitting: Psychiatry

## 2014-02-12 ENCOUNTER — Encounter (HOSPITAL_COMMUNITY): Payer: Self-pay | Admitting: Psychiatry

## 2014-02-12 VITALS — BP 131/87 | HR 130 | Ht 62.0 in | Wt 147.0 lb

## 2014-02-12 DIAGNOSIS — F339 Major depressive disorder, recurrent, unspecified: Secondary | ICD-10-CM

## 2014-02-12 DIAGNOSIS — F1210 Cannabis abuse, uncomplicated: Secondary | ICD-10-CM

## 2014-02-12 DIAGNOSIS — F321 Major depressive disorder, single episode, moderate: Secondary | ICD-10-CM

## 2014-02-12 MED ORDER — FLUOXETINE HCL 40 MG PO CAPS: 40.0000 mg | ORAL_CAPSULE | Freq: Every day | ORAL | Status: DC

## 2014-02-12 MED ORDER — QUETIAPINE FUMARATE 100 MG PO TABS: ORAL_TABLET | ORAL | Status: DC

## 2014-02-12 MED ORDER — HYDROXYZINE PAMOATE 25 MG PO CAPS: 25.0000 mg | ORAL_CAPSULE | Freq: Two times a day (BID) | ORAL | Status: DC | PRN

## 2014-02-12 NOTE — Progress Notes (Signed)
[facility] [phone] Progress Note   Deborah Cobb [id number] 53 y.o.  02/12/2014 2:24 PM  Chief Complaint:  I like Vistaril .  It is helping anxiety.    History of Present Illness:  [place] and for her followup appointment.  She was seen first time on August 11.  She was referred from intensive outpatient program .  She presented initially to the emergency room because of confusion, memory problems, depression and anxiety symptoms.  She was started on Seroquel 100 mg at bedtime and Vistaril 25 mg as needed.  Patient shown some improvement in her anxiety and sleep.  She continues to have racing thoughts and stress.  She is facing investigation from nursing board and she is thinking to get some legal help.  She has contact with her lawyers because she does not want to proceed by herself.  Her daughter is moving today to [place] and she is very emotional about her.  Her daughter is 24 years old and she is living with her however she is not working and she is not in any relationship.  She is hoping her move to [place] may help her.  Her son who has been involved in drugs is currently living with her but he has been not involved in any recent stealing of any legal issues.  Patient is taking quetiapine 100 mg at bedtime and she was able to sleep a few hours.  She is also taking Vistaril however sometimes twice a day to help her anxiety.  She has some crying spells, episodes of hopelessness and worthlessness but denies any active or passive suicidal thoughts or homicidal thoughts.  She is actively drinking for a part-time job.  Her mother is currently visiting [place] to visit her sister however patient will go [place] to bring her back [place] where she lives.  She denies any side effects of medication.  She denies any paranoia or any hallucination.  She is now drinking when she has cut down her cannabis abuse significantly.  She also continue Prozac, Vistaril and Seroquel at present dose.  Her  appetite is okay.  Her vitals are stable.  Suicidal Ideation: No Plan Formed: No Patient has means to carry out plan: No  Homicidal Ideation: No Plan Formed: No Patient has means to carry out plan: No  Past Psychiatric History/Hospitalization(s) The patient has depression and anxiety symptoms were long time.  She was seeing Dr. Toy Care for more than 12 years.  She was getting Xanax from her which was working okay until December of last year she decided to come off because her son was stealing her Xanax.  She has been on Prozac for a long time.  She also tried Depakote however she does not feel any improvement. Anxiety: Yes Bipolar Disorder: No Depression: Yes Mania: No Psychosis: No Schizophrenia: No Personality Disorder: No Hospitalization for psychiatric illness: Patient was admitted to OBS unit. History of Electroconvulsive Shock Therapy: No Prior Suicide Attempts: No  Medical History; Patient has headache.  She has not seen her primary care physician in a while.  Review of Systems: Psychiatric: Agitation: No Hallucination: No Depressed Mood: Yes Insomnia: No Hypersomnia: No Altered Concentration: No Feels Worthless: No Grandiose Ideas: No Belief In Special Powers: No New/Increased Substance Abuse: Patient continues to use marijuana Compulsions: No  Neurologic: Headache: Yes Seizure: No Paresthesias: No    Outpatient Encounter Prescriptions as of 02/12/2014  Medication Sig  . FLUoxetine (PROZAC) 40 MG capsule Take 1 capsule (40 mg total) by  mouth daily.  . hydrOXYzine (VISTARIL) 25 MG capsule Take 1 capsule (25 mg total) by mouth 2 (two) times daily as needed for anxiety.  Marland Kitchen QUEtiapine (SEROQUEL) 100 MG tablet Take 1/2 to 1 tab at bed time  . [DISCONTINUED] FLUoxetine (PROZAC) 40 MG capsule Take 1 capsule (40 mg total) by mouth daily.  . [DISCONTINUED] hydrOXYzine (VISTARIL) 25 MG capsule Take 1 capsule (25 mg total) by mouth 2 (two) times daily as needed for anxiety.   . [DISCONTINUED] QUEtiapine (SEROQUEL) 100 MG tablet Take 1/2 to 1 tab at bed time    Recent Results (from the past 2160 hour(s))  TSH     Status: None   Collection Time    12/21/13 10:21 PM      Result Value Ref Range   TSH 2.080  0.350 - 4.500 uIU/mL   Comment: Performed at [facility]: None   Collection Time    12/21/13 10:22 PM      Result Value Ref Range   Acetaminophen (Tylenol), Serum <15.0  10 - 30 ug/mL   Comment:            THERAPEUTIC CONCENTRATIONS VARY     SIGNIFICANTLY. A RANGE OF 10-30     ug/mL MAY BE AN EFFECTIVE     CONCENTRATION FOR MANY PATIENTS.     HOWEVER, SOME ARE BEST TREATED     AT CONCENTRATIONS OUTSIDE THIS     RANGE.     ACETAMINOPHEN CONCENTRATIONS     >150 ug/mL AT 4 HOURS AFTER     INGESTION AND >50 ug/mL AT 12     HOURS AFTER INGESTION ARE     OFTEN ASSOCIATED WITH TOXIC     REACTIONS.  CBC     Status: None   Collection Time    12/21/13 10:22 PM      Result Value Ref Range   WBC 8.0  4.0 - 10.5 K/uL   RBC 4.47  3.87 - 5.11 MIL/uL   Hemoglobin 14.3  12.0 - 15.0 g/dL   HCT 42.1  36.0 - 46.0 %   MCV 94.2  78.0 - 100.0 fL   MCH 32.0  26.0 - 34.0 pg   MCHC 34.0  30.0 - 36.0 g/dL   RDW 14.4  11.5 - 15.5 %   Platelets 217  150 - 400 K/uL  COMPREHENSIVE METABOLIC PANEL     Status: None   Collection Time    12/21/13 10:22 PM      Result Value Ref Range   Sodium 140  137 - 147 mEq/L   Potassium 3.9  3.7 - 5.3 mEq/L   Chloride 103  96 - 112 mEq/L   CO2 24  19 - 32 mEq/L   Glucose, Bld 92  70 - 99 mg/dL   BUN 9  6 - 23 mg/dL   Creatinine, Ser 0.77  0.50 - 1.10 mg/dL   Calcium 10.0  8.4 - 10.5 mg/dL   Total Protein 7.2  6.0 - 8.3 g/dL   Albumin 3.9  3.5 - 5.2 g/dL   AST 17  0 - 37 U/L   ALT 13  0 - 35 U/L   Alkaline Phosphatase 92  39 - 117 U/L   Total Bilirubin 0.5  0.3 - 1.2 mg/dL   GFR calc non Af Amer >90  >90 mL/min   GFR calc Af Amer >90  >90 mL/min   Comment: (NOTE)  The eGFR has  been calculated using the CKD EPI equation.     This calculation has not been validated in all clinical situations.     eGFR's persistently <90 mL/min signify possible Chronic Kidney     Disease.   Anion gap 13  5 - 15  ETHANOL     Status: None   Collection Time    12/21/13 10:22 PM      Result Value Ref Range   Alcohol, Ethyl (B) <11  0 - 11 mg/dL   Comment:            LOWEST DETECTABLE LIMIT FOR     SERUM ALCOHOL IS 11 mg/dL     FOR MEDICAL PURPOSES ONLY  SALICYLATE LEVEL     Status: Abnormal   Collection Time    12/21/13 10:22 PM      Result Value Ref Range   Salicylate Lvl <7.8 (*) 2.8 - 20.0 mg/dL  URINE RAPID DRUG SCREEN (HOSP PERFORMED)     Status: Abnormal   Collection Time    12/21/13 10:28 PM      Result Value Ref Range   Opiates NONE DETECTED  NONE DETECTED   Cocaine NONE DETECTED  NONE DETECTED   Benzodiazepines POSITIVE (*) NONE DETECTED   Amphetamines NONE DETECTED  NONE DETECTED   Tetrahydrocannabinol POSITIVE (*) NONE DETECTED   Barbiturates NONE DETECTED  NONE DETECTED   Comment:            DRUG SCREEN FOR MEDICAL PURPOSES     ONLY.  IF CONFIRMATION IS NEEDED     FOR ANY PURPOSE, NOTIFY LAB     WITHIN 5 DAYS.                LOWEST DETECTABLE LIMITS     FOR URINE DRUG SCREEN     Drug Class       Cutoff (ng/mL)     Amphetamine      1000     Barbiturate      200     Benzodiazepine   938     Tricyclics       101     Opiates          300     Cocaine          300     THC              50  URINE CULTURE     Status: None   Collection Time    12/21/13 11:00 PM      Result Value Ref Range   Specimen Description URINE, CLEAN CATCH     Special Requests NONE     Culture  Setup Time       Value: 12/22/2013 17:01     Performed at [facility]       Value: NO GROWTH     Performed at [facility]   Culture       Value: NO GROWTH     Performed at [facility]   Report Status 12/23/2013 FINAL    URINALYSIS, ROUTINE W  REFLEX MICROSCOPIC     Status: Abnormal   Collection Time    12/21/13 11:00 PM      Result Value Ref Range   Color, Urine YELLOW  YELLOW   APPearance CLEAR  CLEAR   Specific Gravity, Urine 1.007  1.005 - 1.030   pH 6.0  5.0 - 8.0  Glucose, UA NEGATIVE  NEGATIVE mg/dL   Hgb urine dipstick TRACE (*) NEGATIVE   Bilirubin Urine NEGATIVE  NEGATIVE   Ketones, ur NEGATIVE  NEGATIVE mg/dL   Protein, ur NEGATIVE  NEGATIVE mg/dL   Urobilinogen, UA 0.2  0.0 - 1.0 mg/dL   Nitrite NEGATIVE  NEGATIVE   Leukocytes, UA NEGATIVE  NEGATIVE  URINE MICROSCOPIC-ADD ON     Status: None   Collection Time    12/21/13 11:00 PM      Result Value Ref Range   Squamous Epithelial / LPF RARE  RARE   RBC / HPF 0-2  <3 RBC/hpf      Physical Exam: Constitutional:  BP 131/87  Pulse 130  Ht 5' 2"  (1.575 m)  Wt 147 lb (66.679 kg)  BMI 26.88 kg/m2  Musculoskeletal: Strength & Muscle Tone: within normal limits Gait & Station: normal Patient leans: N/A  Mental Status Examination;  Patient is casually dressed and fairly groomed.  She maintains fair eye contact.  She described her mood as sad and frustrated.  Her affect is constricted.  She is easily tearful when she is talking about her financial strain.  Her speech is coherent, clear with normal tone and volume.  Her thought process is logical and goal-directed.  There were no tremors or shakes.  There were no paranoia, delusional or any obsessive thoughts.  Her psychomotor activity is slightly decreased.  Her fund of knowledge is good.  There were no flight of ideas or any loose association.  There were no tremors or shakes.  She is alert and oriented x3.  Her insight judgment and impulse control is okay.   Review of Psycho-Social Stressors (1), Review and summation of old records (2), Established Problem, Worsening (2), Review of Medication Regimen & Side Effects (2) and Review of New Medication or Change in Dosage (2)  Assessment: Axis I: Maj. depressive  disorder, recurrent, cannabis abuse  Axis II: Deferred  Axis III:  Past Medical History  Diagnosis Date  . Anxiety   . Substance abuse   . Allergy   . Bipolar disorder   . Headache(784.0)     Axis IV: Moderate   Plan:  I recommended to continue Prozac and Seroquel as prescribed and tried to take Vistaril 25 mg twice a day.  Patient is not working and she has feeling that anxious when she is by herself.  Reinforce counseling but patient declined because of financial reasons.  Patient is looking for a part-time job.  Discusses to think about volunteer work to keep herself busy.  At this time patient does not have any side effects of medication.  Strongly recommended to stop marijuana and discuss interaction of substances with psychotropic medication and delayed recovery of her psychiatric illness.  Recommended to call us back if she has any question or any concern.  I will see her again in 2 months. Time spent 55 minutes.  More than 50% of the time spent in psychoeducation, counseling and coordination of care.  Discuss safety plan that anytime having active suicidal thoughts or homicidal thoughts then patient need to call 911 or go to the local emergency room.    Keanna Tugwell T., MD 02/12/2014

## 2014-04-09 ENCOUNTER — Other Ambulatory Visit (HOSPITAL_COMMUNITY): Payer: Self-pay | Admitting: Psychiatry

## 2014-04-14 ENCOUNTER — Ambulatory Visit (HOSPITAL_COMMUNITY): Payer: Self-pay | Admitting: Psychiatry

## 2014-05-02 ENCOUNTER — Other Ambulatory Visit (HOSPITAL_COMMUNITY): Payer: Self-pay | Admitting: Psychiatry

## 2014-05-03 NOTE — Telephone Encounter (Signed)
Chart reviewed, refill not appropriate. Last refill stated that appointment needed to be made for future fills. No future appointment at this time. Denied.

## 2014-05-05 ENCOUNTER — Telehealth (HOSPITAL_COMMUNITY): Payer: Self-pay | Admitting: *Deleted

## 2014-05-05 ENCOUNTER — Other Ambulatory Visit (HOSPITAL_COMMUNITY): Payer: Self-pay | Admitting: Psychiatry

## 2014-05-05 DIAGNOSIS — F321 Major depressive disorder, single episode, moderate: Secondary | ICD-10-CM

## 2014-05-05 MED ORDER — HYDROXYZINE PAMOATE 25 MG PO CAPS: ORAL_CAPSULE | ORAL | Status: DC

## 2014-05-05 NOTE — Telephone Encounter (Signed)
Patient left VM: Has appt on 05/15/14 Needs refill of Vistaril. States only taking Vistaril as cannot afford any other prescribed medications

## 2014-05-15 ENCOUNTER — Ambulatory Visit (INDEPENDENT_AMBULATORY_CARE_PROVIDER_SITE_OTHER): Payer: Self-pay | Admitting: Psychiatry

## 2014-05-15 ENCOUNTER — Encounter (HOSPITAL_COMMUNITY): Payer: Self-pay | Admitting: Psychiatry

## 2014-05-15 VITALS — BP 127/66 | HR 71 | Ht 66.0 in | Wt 163.8 lb

## 2014-05-15 DIAGNOSIS — F339 Major depressive disorder, recurrent, unspecified: Secondary | ICD-10-CM

## 2014-05-15 DIAGNOSIS — F1210 Cannabis abuse, uncomplicated: Secondary | ICD-10-CM

## 2014-05-15 DIAGNOSIS — F321 Major depressive disorder, single episode, moderate: Secondary | ICD-10-CM

## 2014-05-15 MED ORDER — FLUOXETINE HCL 20 MG PO CAPS: 20.0000 mg | ORAL_CAPSULE | Freq: Every day | ORAL | Status: DC

## 2014-05-15 MED ORDER — HYDROXYZINE PAMOATE 25 MG PO CAPS: ORAL_CAPSULE | ORAL | Status: DC

## 2014-05-15 MED ORDER — QUETIAPINE FUMARATE 100 MG PO TABS: ORAL_TABLET | ORAL | Status: DC

## 2014-05-15 NOTE — Progress Notes (Signed)
[facility] [id number] Progress Note   Deborah Cobb [id number] 53 y.o.  05/15/2014 11:36 AM  Chief Complaint:  I ran out from Seroquel and Prozac.  I'm feeling very nervous and anxious and having crying spells.     History of Present Illness:  Deborah Cobb came for her follow-up appointment.  She called us few weeks ago that she does not want to take Seroquel and Prozac because she cannot afford however [facility] but ran out 10 days ago.  She was unable to come on her last appointment.  She is complaining of increased anxiety, crying spells, poor sleep and emotional.  She admitted getting tired because recently she is taking care of her mother who traveled from [place] to [place] to [place] then back to [place].  She also missed a lot of her daughter who moved to [place] but she is happy about her.  She has not heard from board of nursing and she is very anxious about the decision.  She may lose her license however she hired an Pensions consultantattorney who is working on her case.  She continues to smoke marijuana but now she is willing to get some help.  She admitted racing thoughts, feeling overwhelmed and stressed out.  She admitted having financial strain but like to continue Seroquel and Prozac.  She wants to cut down her Prozac because she had tried 40 mg which is causing some time shakes and nervousness.  Patient denies any hallucination or any paranoia.  She denies any active or passive suicidal thoughts.  Her appetite is okay.  Her vitals are stable.  Her son is living with his girlfriend at her house.  She is relieved that her son is not engaged in any illegal activities.  Suicidal Ideation: No Plan Formed: No Patient has means to carry out plan: No  Homicidal Ideation: No Plan Formed: No Patient has means to carry out plan: No  Past Psychiatric History/Hospitalization(s) Patient has history of depression and anxiety symptoms for a long time.  She was  seeing Dr. Evelene CroonKaur for more than 12 years.  She was getting Xanax from her which was working okay until December of last year she decided to come off because her son was stealing her Xanax.  She has been on Prozac for a long time.  She also tried Depakote however she does not feel any improvement. Anxiety: Yes Bipolar Disorder: No Depression: Yes Mania: No Psychosis: No Schizophrenia: No Personality Disorder: No Hospitalization for psychiatric illness: Patient was admitted to OBS unit. History of Electroconvulsive Shock Therapy: No Prior Suicide Attempts: No  Medical History; Patient has headache.  She has not seen her primary care physician in a while.  Review of Systems  Constitutional: Positive for malaise/fatigue.  Psychiatric/Behavioral: Positive for depression and substance abuse. The patient is nervous/anxious and has insomnia.      Psychiatric: Agitation: No Hallucination: No Depressed Mood: Yes Insomnia: Yes Hypersomnia: No Altered Concentration: No Feels Worthless: No Grandiose Ideas: No Belief In Special Powers: No New/Increased Substance Abuse: Patient continues to use marijuana Compulsions: No  Neurologic: Headache: Yes Seizure: No Paresthesias: No    Outpatient Encounter Prescriptions as of 05/15/2014  Medication Sig  . FLUoxetine (PROZAC) 20 MG capsule Take 1 capsule (20 mg total) by mouth daily.  . hydrOXYzine (VISTARIL) 25 MG capsule TAKE ONE CAPSULE BY MOUTH TWICE A DAY AS NEEDED FOR ANXIETY  . QUEtiapine (SEROQUEL) 100 MG tablet Take 1/2  to 1 tab at bed time  . [DISCONTINUED] FLUoxetine (PROZAC) 40 MG capsule Take 1 capsule (40 mg total) by mouth daily.  . [DISCONTINUED] hydrOXYzine (VISTARIL) 25 MG capsule TAKE ONE CAPSULE BY MOUTH TWICE A DAY AS NEEDED FOR ANXIETY  . [DISCONTINUED] QUEtiapine (SEROQUEL) 100 MG tablet Take 1/2 to 1 tab at bed time    No results found for this or any previous visit (from the past 2160 hour(s)).    Physical  Exam: Constitutional:  BP 127/66 mmHg  Pulse 71  Ht 5\' 6"  (1.676 m)  Wt 163 lb 12.8 oz (74.299 kg)  BMI 26.45 kg/m2  Musculoskeletal: Strength & Muscle Tone: within normal limits Gait & Station: normal Patient leans: N/A  Mental Status Examination;  Patient is casually dressed and fairly groomed.  She appeared anxious, nervous and described her mood sad and her affect is constricted.  Her speech is clear and coherent.  Her attention and concentration is distracted at times.  Her thought process is logical and goal-directed.  There were no tremors or shakes.  There were no paranoia, delusional or any obsessive thoughts.  Her psychomotor activity is slightly decreased.  Her fund of knowledge is good.  There were no flight of ideas or any loose association.  There were no tremors or shakes.  She is alert and oriented x3.  Her insight judgment and impulse control is okay.   Review of Psycho-Social Stressors (1), Established Problem, Worsening (2), Review of Last Therapy Session (1), Review of Medication Regimen & Side Effects (2) and Review of New Medication or Change in Dosage (2)  Assessment: Axis I: Maj. depressive disorder, recurrent, cannabis abuse  Axis II: Deferred  Axis III:  Past Medical History  Diagnosis Date  . Anxiety   . Substance abuse   . Allergy   . Bipolar disorder   . Headache(784.0)     Axis IV: Moderate   Plan:  I had a long discussion with noncompliance of medication cause risk to relapse into illness.  She agreed and she wants to restart her Seroquel and Prozac.  However she wants to try low-dose of Prozac because she tried 40 mg which cause shakes.  We also talk about stopping marijuana and she is willing to seek help.  [facility] because she cannot afford individual counseling.  Patient denies any side effects of Seroquel and Vistaril.  I will continue Seroquel 100 mg at bedtime, Vistaril 25 mg  twice a day.  Recommended to call us back if she has any question or any concern.  In the discussion of Prozac 20 mg is given.  I will see her again in 3 months. Time spent 25 minutes.  More than 50% of the time spent in psychoeducation, counseling and coordination of care.  Discuss safety plan that anytime having active suicidal thoughts or homicidal thoughts then patient need to call 911 or go to the local emergency room.    Fern Asmar T., MD 05/15/2014

## 2014-08-14 ENCOUNTER — Encounter (HOSPITAL_COMMUNITY): Payer: Self-pay | Admitting: Psychiatry

## 2014-08-14 ENCOUNTER — Ambulatory Visit (INDEPENDENT_AMBULATORY_CARE_PROVIDER_SITE_OTHER): Payer: Self-pay | Admitting: Psychiatry

## 2014-08-14 VITALS — BP 129/70 | HR 75 | Ht 65.0 in | Wt 165.2 lb

## 2014-08-14 DIAGNOSIS — F1210 Cannabis abuse, uncomplicated: Secondary | ICD-10-CM

## 2014-08-14 DIAGNOSIS — F321 Major depressive disorder, single episode, moderate: Secondary | ICD-10-CM

## 2014-08-14 MED ORDER — QUETIAPINE FUMARATE 100 MG PO TABS: ORAL_TABLET | ORAL | Status: DC

## 2014-08-14 MED ORDER — HYDROXYZINE PAMOATE 25 MG PO CAPS: ORAL_CAPSULE | ORAL | Status: DC

## 2014-08-14 MED ORDER — FLUOXETINE HCL 20 MG PO CAPS: 20.0000 mg | ORAL_CAPSULE | Freq: Every day | ORAL | Status: DC

## 2014-08-14 NOTE — Progress Notes (Signed)
[facility] [id number] Progress Note   Deborah Cobb [id number] 54 y.o.  08/14/2014 4:36 PM  Chief Complaint:  Medication management and follow-up.    History of Present Illness:  Deborah Cobb came for her follow-up appointment.  She is compliant with Seroquel, Vistaril and Prozac.  She is feeling much better and she sleeping much better.  She denies any nightmares flashback or any fear.  She still reading the response from nursing board.  Sometimes she gets anxious because it has been 8 months she has not heard from them.  She is planning to visit [place] to visit her mother who is been hospitalized because of GI bleed.  Overall she likes her medication because it is helping her depression, anxiety, crying spells and sleep.  She is relieved that her son is now going to [facility].  However she still has not have a trust on him.  Patient denies any hallucination, paranoia, suicidal parts or homicidal thought.  She is not drinking or using any illegal substances.  Her appetite is okay.  Her vitals are stable.  Suicidal Ideation: No Plan Formed: No Patient has means to carry out plan: No  Homicidal Ideation: No Plan Formed: No Patient has means to carry out plan: No  Past Psychiatric History/Hospitalization(s) Patient has history of depression and anxiety symptoms for a long time.  She was seeing Dr. Evelene CroonKaur for more than 12 years.  She was getting Xanax from her which was working okay until December of last year she decided to come off because her son was stealing her Xanax.  She has been on Prozac for a long time.  She also tried Depakote however she does not feel any improvement. Anxiety: Yes Bipolar Disorder: No Depression: Yes Mania: No Psychosis: No Schizophrenia: No Personality Disorder: No Hospitalization for psychiatric illness: Patient was admitted to OBS unit. History of Electroconvulsive Shock Therapy: No Prior Suicide Attempts: No  Medical History; Patient has headache.  She has not  seen her primary care physician in a while.  ROS   Psychiatric: Agitation: No Hallucination: No Depressed Mood: No Insomnia: No Hypersomnia: No Altered Concentration: No Feels Worthless: No Grandiose Ideas: No Belief In Special Powers: No New/Increased Substance Abuse: No Compulsions: No  Neurologic: Headache: No Seizure: No Paresthesias: No    Outpatient Encounter Prescriptions as of 08/14/2014  Medication Sig  . FLUoxetine (PROZAC) 20 MG capsule Take 1 capsule (20 mg total) by mouth daily.  . hydrOXYzine (VISTARIL) 25 MG capsule TAKE ONE CAPSULE BY MOUTH TWICE A DAY AS NEEDED FOR ANXIETY  . QUEtiapine (SEROQUEL) 100 MG tablet Take 1/2 to 1 tab at bed time  . [DISCONTINUED] FLUoxetine (PROZAC) 20 MG capsule Take 1 capsule (20 mg total) by mouth daily.  . [DISCONTINUED] hydrOXYzine (VISTARIL) 25 MG capsule TAKE ONE CAPSULE BY MOUTH TWICE A DAY AS NEEDED FOR ANXIETY  . [DISCONTINUED] QUEtiapine (SEROQUEL) 100 MG tablet Take 1/2 to 1 tab at bed time    No results found for this or any previous visit (from the past 2160 hour(s)).    Physical Exam: Constitutional:  BP 129/70 mmHg  Pulse 75  Ht 5\' 5"  (1.651 m)  Wt 165 lb 3.2 oz (74.934 kg)  BMI 27.49 kg/m2  Musculoskeletal: Strength & Muscle Tone: within normal limits Gait & Station: normal Patient leans: N/A  Mental Status Examination;  Patient is casually dressed and fairly groomed.  She is pleasant and cooperative.  She described her mood good and her affect is appropriate.  Her speech is clear and coherent.  Her attention and concentration is okay.  Her thought process is logical and goal-directed.  There were no tremors or shakes.  There were no paranoia, delusional or any obsessive thoughts.  Her psychomotor activity is slightly decreased.  Her fund of knowledge is good.  There were no flight of ideas or any loose association.  There were no tremors or shakes.  She is alert and oriented x3.  Her insight judgment  and impulse control is okay.   Established Problem, Stable/Improving (1), Review of Psycho-Social Stressors (1), Review of Last Therapy Session (1) and Review of Medication Regimen & Side Effects (2)  Assessment: Axis I: Maj. depressive disorder, recurrent, cannabis abuse  Axis II: Deferred  Axis III:  Past Medical History  Diagnosis Date  . Anxiety   . Substance abuse   . Allergy   . Bipolar disorder   . Headache(784.0)     Plan:  Patient is fairly stable on her current medication.  She is taking Seroquel 100 mg at bedtime, Prozac 20 mg daily and Vistaril twice a day.  Discussed medication side effects and benefits.  Recommended to call us back if she has any question, concern or if she feels worsening of the symptom.  Follow-up in 3 months.  ARFEEN,SYED T., MD 08/14/2014

## 2014-11-13 ENCOUNTER — Ambulatory Visit (HOSPITAL_COMMUNITY): Payer: Self-pay | Admitting: Psychiatry

## 2014-11-17 ENCOUNTER — Telehealth (HOSPITAL_COMMUNITY): Payer: Self-pay

## 2014-11-17 NOTE — Telephone Encounter (Signed)
Medication refill requests received from patient for Lamictal and Seroquel. Patient no showed for evaluaton on 11/13/14 but rescheduled herself for 12/04/14. Meds last filled at evaluation on 08/14/14 plus 2 refills.

## 2014-11-18 NOTE — Telephone Encounter (Signed)
30 days only.

## 2014-11-19 ENCOUNTER — Other Ambulatory Visit (HOSPITAL_COMMUNITY): Payer: Self-pay | Admitting: Psychiatry

## 2014-11-19 DIAGNOSIS — F321 Major depressive disorder, single episode, moderate: Secondary | ICD-10-CM

## 2014-11-21 MED ORDER — FLUOXETINE HCL 20 MG PO CAPS: 20.0000 mg | ORAL_CAPSULE | Freq: Every day | ORAL | Status: DC

## 2014-11-21 NOTE — Telephone Encounter (Signed)
Met with Dr. Adele Schilder who authorized a one time 30 day supply order of patient's Prozac and Seroquel.  New orders for both medications [facility]-scribed to [facility] [place]  [facility].  Left patient a message a one time order for each was sent into her pharmacy and requested patient keep her appointment for further refills.

## 2014-11-29 ENCOUNTER — Other Ambulatory Visit (HOSPITAL_COMMUNITY): Payer: Self-pay | Admitting: Psychiatry

## 2014-12-01 NOTE — Telephone Encounter (Signed)
Seroquel request denied as patient was given a 30 day supply on 11/21/14 and returns on 12/04/14.

## 2014-12-04 ENCOUNTER — Ambulatory Visit (INDEPENDENT_AMBULATORY_CARE_PROVIDER_SITE_OTHER): Payer: Self-pay | Admitting: Psychiatry

## 2014-12-04 ENCOUNTER — Encounter (HOSPITAL_COMMUNITY): Payer: Self-pay | Admitting: Psychiatry

## 2014-12-04 VITALS — BP 119/66 | HR 61 | Ht 65.0 in | Wt 168.6 lb

## 2014-12-04 DIAGNOSIS — F339 Major depressive disorder, recurrent, unspecified: Secondary | ICD-10-CM

## 2014-12-04 DIAGNOSIS — F321 Major depressive disorder, single episode, moderate: Secondary | ICD-10-CM

## 2014-12-04 MED ORDER — HYDROXYZINE PAMOATE 25 MG PO CAPS: ORAL_CAPSULE | ORAL | Status: DC

## 2014-12-04 MED ORDER — QUETIAPINE FUMARATE 100 MG PO TABS: ORAL_TABLET | ORAL | Status: DC

## 2014-12-04 MED ORDER — FLUOXETINE HCL 20 MG PO CAPS: 20.0000 mg | ORAL_CAPSULE | Freq: Every day | ORAL | Status: DC
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
## 2014-12-11 ENCOUNTER — Encounter: Payer: Self-pay | Admitting: Gastroenterology

## 2015-03-09 ENCOUNTER — Ambulatory Visit (INDEPENDENT_AMBULATORY_CARE_PROVIDER_SITE_OTHER): Payer: Self-pay | Admitting: Psychiatry

## 2015-03-09 ENCOUNTER — Encounter (HOSPITAL_COMMUNITY): Payer: Self-pay | Admitting: Psychiatry

## 2015-03-09 VITALS — BP 118/84 | HR 74 | Ht 66.0 in | Wt 168.4 lb

## 2015-03-09 DIAGNOSIS — F321 Major depressive disorder, single episode, moderate: Secondary | ICD-10-CM

## 2015-03-09 DIAGNOSIS — F332 Major depressive disorder, recurrent severe without psychotic features: Secondary | ICD-10-CM

## 2015-03-09 MED ORDER — QUETIAPINE FUMARATE 100 MG PO TABS: ORAL_TABLET | ORAL | Status: DC

## 2015-03-09 MED ORDER — HYDROXYZINE PAMOATE 25 MG PO CAPS: ORAL_CAPSULE | ORAL | Status: DC

## 2015-03-09 MED ORDER — FLUOXETINE HCL 20 MG PO CAPS: 20.0000 mg | ORAL_CAPSULE | Freq: Every day | ORAL | Status: DC
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
## 2015-04-07 ENCOUNTER — Telehealth (HOSPITAL_COMMUNITY): Payer: Self-pay

## 2015-04-07 NOTE — Telephone Encounter (Signed)
Medication management - Telephone message left for patient after she left one stating she was going through a crisis and just wanted to check in to see if there was something else she could do.  Denied SI/HI on message so requested pt. call back to discuss.  Informed patient if she needed to be seen she could always come into our hospital for an evaluation with the TTS department.  Requested patient call this nurse back once she receives this message to discuss and to assist her with follow up if needed.

## 2015-04-13 ENCOUNTER — Ambulatory Visit (HOSPITAL_COMMUNITY): Payer: Self-pay | Admitting: Psychiatry

## 2015-05-25 ENCOUNTER — Ambulatory Visit (HOSPITAL_COMMUNITY): Payer: Self-pay | Admitting: Psychiatry

## 2015-07-01 ENCOUNTER — Ambulatory Visit (INDEPENDENT_AMBULATORY_CARE_PROVIDER_SITE_OTHER): Payer: Self-pay | Admitting: Psychiatry

## 2015-07-01 ENCOUNTER — Encounter (HOSPITAL_COMMUNITY): Payer: Self-pay | Admitting: Psychiatry

## 2015-07-01 VITALS — BP 127/81 | HR 61 | Ht 65.0 in | Wt 157.8 lb

## 2015-07-01 DIAGNOSIS — Z79899 Other long term (current) drug therapy: Secondary | ICD-10-CM

## 2015-07-01 DIAGNOSIS — F321 Major depressive disorder, single episode, moderate: Secondary | ICD-10-CM

## 2015-07-01 MED ORDER — FLUOXETINE HCL 20 MG PO CAPS: 20.0000 mg | ORAL_CAPSULE | Freq: Every day | ORAL | Status: DC

## 2015-07-01 MED ORDER — QUETIAPINE FUMARATE 100 MG PO TABS: ORAL_TABLET | ORAL | Status: DC

## 2015-07-01 MED ORDER — HYDROXYZINE PAMOATE 25 MG PO CAPS: ORAL_CAPSULE | ORAL | Status: DC
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
## 2015-09-29 ENCOUNTER — Ambulatory Visit (INDEPENDENT_AMBULATORY_CARE_PROVIDER_SITE_OTHER): Payer: Self-pay | Admitting: Psychiatry

## 2015-09-29 ENCOUNTER — Encounter (HOSPITAL_COMMUNITY): Payer: Self-pay | Admitting: Psychiatry

## 2015-09-29 VITALS — BP 137/77 | HR 79 | Ht 65.0 in | Wt 145.6 lb

## 2015-09-29 DIAGNOSIS — F321 Major depressive disorder, single episode, moderate: Secondary | ICD-10-CM

## 2015-09-29 DIAGNOSIS — Z79899 Other long term (current) drug therapy: Secondary | ICD-10-CM

## 2015-09-29 MED ORDER — QUETIAPINE FUMARATE 100 MG PO TABS: ORAL_TABLET | ORAL | Status: DC

## 2015-09-29 MED ORDER — FLUOXETINE HCL 20 MG PO CAPS: 20.0000 mg | ORAL_CAPSULE | Freq: Every day | ORAL | Status: DC

## 2015-09-29 MED ORDER — HYDROXYZINE PAMOATE 25 MG PO CAPS: ORAL_CAPSULE | ORAL | Status: DC

## 2015-09-29 NOTE — Progress Notes (Signed)
[facility] [id number] Progress Note   Deborah CowerBrenda D Cobb [id number] 55 y.o.  09/29/2015 3:04 PM  Chief Complaint:   medication management and follow-up.    History of Present Illness:  [name] came for her follow-up appointment.   She apologized because she was running late for her appointment.  She endorse increased stress at home because her son does not want to work .  Recently she decided to sell her car because son is not using it and car is still under her name.  Patient told her son did not change the oil and he does not care about the car but got very upset when I decided to sell the car.  She admitted sometimes she does not want to go back to home because she do not want any argument with him.  She is going to [employer] meeting and keeping her sobriety however admitted 3 weeks ago she had one Valium because she was very nervous and anxious.  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  She is hoping to complete her reports and second week of May.  She is also excited that she is getting a job interview to work in [employer].  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  Her new relationship is going very well.  Patient told her boyfriend is supportive.  Her mother continues to have a lot of health issues who lives in [place].  Patient denies any agitation, anger, mood swing.  She admitted some time frustrated because of living situation but denies any active or passive suicidal thoughts or homicidal thought.  Sometimes she does not care about her diet and unintentionally she had lost 10 pounds in past 3 months.  Patient denies any paranoia, hallucination, aggressive behavior.  Patient denies any illegal substance use.  She cannot afford counseling at this time.  Patient denies drinking alcohol.  Her energy level is okay.  Her vitals are stable.  Patient apologizes for not having  blood work but promised to do.  On her next appointment.  She mentioned that she cannot afford hemoglobin A1c but she will do TSH, CBC and CMP.  Suicidal Ideation: No Plan Formed: No Patient has means to carry out plan: No  Homicidal Ideation: No Plan Formed: No Patient has means to carry out plan: No  Past Psychiatric History/Hospitalization(s) Patient was referred from emergency room where she stayed overnight .  She has completed intensive outpatient program .  Patient has history of depression and anxiety symptoms for a long time.  She was seeing Dr. Evelene CroonKaur for more than 12 years.  She was getting Xanax from her which was working okay until December of last year she decided to come off because her son was stealing her Xanax.  She has been on Prozac for a long time.  In the past she had tried Thorazine and Depakote but causes side effects and does not feel any improvement.   Anxiety: Yes Bipolar Disorder: No Depression: Yes Mania: No Psychosis: No Schizophrenia: No Personality Disorder: No Hospitalization for psychiatric illness: Patient was admitted to OBS unit. History of Electroconvulsive Shock Therapy: No Prior Suicide Attempts: No  Medical History; Patient has no active medical problem.  She has not seen her primary care physician in a while.  Review of Systems  Constitutional: Positive for weight loss.  Cardiovascular:  Negative for chest pain and palpitations.  Musculoskeletal: Negative.   Neurological: Negative for dizziness and tremors.  Psychiatric/Behavioral: Negative.      Psychiatric: Agitation: No Hallucination: No Depressed Mood: No Insomnia: No Hypersomnia: No Altered Concentration: No Feels Worthless: No Grandiose Ideas: No Belief In Special Powers: No New/Increased Substance Abuse: No Compulsions: No  Neurologic: Headache: No Seizure: No Paresthesias: No    Outpatient Encounter Prescriptions as of 09/29/2015  Medication Sig  . FLUoxetine (PROZAC)  20 MG capsule Take 1 capsule (20 mg total) by mouth daily.  . hydrOXYzine (VISTARIL) 25 MG capsule TAKE ONE CAPSULE BY MOUTH DAILY AS NEEDED FOR ANXIETY  . QUEtiapine (SEROQUEL) 100 MG tablet TAKE ONE-HALF TO ONE TABLET BY MOUTH AT BEDTIME  . [DISCONTINUED] FLUoxetine (PROZAC) 20 MG capsule Take 1 capsule (20 mg total) by mouth daily.  . [DISCONTINUED] hydrOXYzine (VISTARIL) 25 MG capsule TAKE ONE CAPSULE BY MOUTH TWICE A DAY AS NEEDED FOR ANXIETY  . [DISCONTINUED] QUEtiapine (SEROQUEL) 100 MG tablet TAKE ONE-HALF TO ONE TABLET BY MOUTH AT BEDTIME   No facility-administered encounter medications on file as of 09/29/2015.    No results found for this or any previous visit (from the past 2160 hour(s)).    Physical Exam: Constitutional:  BP 137/77 mmHg  Pulse 79  Ht  (1.651 m)  Wt 145 lb 9.6 oz (66.044 kg)  BMI 24.23 kg/m2  Musculoskeletal: Strength & Muscle Tone: within normal limits Gait & Station: normal Patient leans: N/A  Mental Status Examination;  Patient is casually dressed and fairly groomed.  She is cooperative and maintained fair eye contact.  She described her mood sad and frustrated.  Her affect is constricted.  Her speech is clear and coherent.  Her attention and concentration is okay.  Her thought process is logical and goal-directed.  There were no tremors or shakes.  There were no paranoia, delusional or any obsessive thoughts.  Her psychomotor activity is normal.  Her fund of knowledge is good.  There were no flight of ideas or any loose association.  There were no tremors or shakes.  She is alert and oriented x3.  Her insight judgment and impulse control is okay.   Established Problem, Stable/Improving (1), Review of Psycho-Social Stressors (1), Review of Last Therapy Session (1) and Review of Medication Regimen & Side Effects (2)  Assessment: Axis I: Maj. depressive disorder, recurrent Axis II: Deferred  Axis III:  Past Medical History  Diagnosis Date  .  Anxiety   . Substance abuse   . Allergy   . Bipolar disorder (HCC)   . Headache(784.0)     Plan:  Reinforce blood work.  Patient does not want to do hemoglobin A1c due to expense but agreed to do CBC, CMP and TSH.  I will continue Prozac 20 mg daily, Seroquel 100 mg at bedtime.  She takes Vistaril 25 mg as needed.  She still has refills remaining.  One more time I offered counseling but patient denied due to financial reasons.  Recommended to call us back if she has any question or any concern.  Follow-up in 3 months.  Discuss safety plan that anytime having active suicidal thoughts or homicidal thoughts and she need to call 911 or go to the local emergency room.   Bianney Rockwood T., MD 09/29/2015

## 2015-09-30 ENCOUNTER — Telehealth (HOSPITAL_COMMUNITY): Payer: Self-pay

## 2015-12-14 ENCOUNTER — Other Ambulatory Visit (HOSPITAL_COMMUNITY): Payer: Self-pay | Admitting: Psychiatry

## 2015-12-14 ENCOUNTER — Telehealth (HOSPITAL_COMMUNITY): Payer: Self-pay

## 2015-12-14 DIAGNOSIS — F321 Major depressive disorder, single episode, moderate: Secondary | ICD-10-CM

## 2015-12-14 MED ORDER — FLUOXETINE HCL 20 MG PO CAPS: 20.0000 mg | ORAL_CAPSULE | Freq: Every day | ORAL | Status: DC

## 2015-12-14 NOTE — Telephone Encounter (Signed)
30 day order sent to pharmacy, patient called and informed

## 2015-12-14 NOTE — Telephone Encounter (Signed)
Patient is calling for a refill on Prozac, she has a follow up this month, but will run out a few days before. Please review and advise, thank you

## 2015-12-14 NOTE — Telephone Encounter (Signed)
Okay to refill for 30 days

## 2015-12-30 ENCOUNTER — Ambulatory Visit (HOSPITAL_COMMUNITY): Payer: Self-pay | Admitting: Psychiatry

## 2016-01-06 ENCOUNTER — Other Ambulatory Visit (HOSPITAL_COMMUNITY): Payer: Self-pay

## 2016-01-06 DIAGNOSIS — F321 Major depressive disorder, single episode, moderate: Secondary | ICD-10-CM

## 2016-01-06 MED ORDER — FLUOXETINE HCL 20 MG PO CAPS: 20.0000 mg | ORAL_CAPSULE | Freq: Every day | ORAL | 0 refills | Status: DC

## 2016-02-05 ENCOUNTER — Other Ambulatory Visit (HOSPITAL_COMMUNITY): Payer: Self-pay

## 2016-02-05 ENCOUNTER — Ambulatory Visit (HOSPITAL_COMMUNITY): Payer: Self-pay | Admitting: Psychiatry

## 2016-02-05 DIAGNOSIS — F321 Major depressive disorder, single episode, moderate: Secondary | ICD-10-CM

## 2016-02-05 MED ORDER — FLUOXETINE HCL 20 MG PO CAPS: 20.0000 mg | ORAL_CAPSULE | Freq: Every day | ORAL | 0 refills | Status: DC

## 2016-02-05 MED ORDER — QUETIAPINE FUMARATE 100 MG PO TABS: ORAL_TABLET | ORAL | 1 refills | Status: DC

## 2016-02-05 MED ORDER — HYDROXYZINE PAMOATE 25 MG PO CAPS: ORAL_CAPSULE | ORAL | 0 refills | Status: DC

## 2016-02-05 NOTE — Progress Notes (Signed)
Patient ID: Cathlean CowerBrenda D Windish, female   DOB: 02/14/1961, 55 y.o.   MRN: [id number] Patient came in for refills on her medications, refill was appropriate and patient has a follow up on 10/27. I sent prescriptions into pharmacy per protocol. Patient would like me to make a note of her weight which is down to 130 -

## 2016-03-31 ENCOUNTER — Ambulatory Visit (HOSPITAL_COMMUNITY): Payer: Self-pay | Admitting: Psychiatry

## 2016-04-06 ENCOUNTER — Ambulatory Visit (INDEPENDENT_AMBULATORY_CARE_PROVIDER_SITE_OTHER): Payer: Self-pay | Admitting: Psychiatry

## 2016-04-06 ENCOUNTER — Encounter (HOSPITAL_COMMUNITY): Payer: Self-pay | Admitting: Psychiatry

## 2016-04-06 DIAGNOSIS — F321 Major depressive disorder, single episode, moderate: Secondary | ICD-10-CM

## 2016-04-06 MED ORDER — FLUOXETINE HCL 20 MG PO CAPS: 20.0000 mg | ORAL_CAPSULE | Freq: Every day | ORAL | 1 refills | Status: DC

## 2016-04-06 MED ORDER — QUETIAPINE FUMARATE 100 MG PO TABS: ORAL_TABLET | ORAL | 1 refills | Status: DC

## 2016-04-06 NOTE — Progress Notes (Signed)
[facility] [id number] Progress Note   Deborah Cobb [id number] 55 y.o.  04/06/2016 10:42 AM  Chief Complaint:  I forgot blood work.  I have a lot of financial issues.  My court date is coming very soon.  I am without my best car which was sold for only $500.    History of Present Illness:  Deborah Cobb came for her follow-up appointment.   She is complaining of increased financial stress and now facing court dates to settle foreclosure .  She is trying to work with [employer] but she is afraid she may lose the house.  She is relieved that her son finally moved out in August but she still have a lot of financial burden.  She has to sell her better car for only $500 and she is using or car.  She is no longer taking Vistaril because it may be prohibited to take since she is with the program with nursing board of [place] and she supposed to give them report weekly about her medication intake.  She feels proud that she has not use any benzo in past few months.  She is taking Seroquel half tablet because she cannot afford 4 tablet at this time.  She admitted some nights poor sleep.  She has a major panic attack when she was sold her car but she did not took any medication.  She is losing weight due to lack of appetite.  Sometimes she does not have enough money to buy grocery.  She is working at [employer] 40 hours a week and Deborah Cobb paying the [employer] and other necessary bills.  She did not have blood work but she promised to have it done today.  She lost almost 20 pounds in recent weeks.  Her energy level is low.  She denies any active or passive suicidal thoughts or homicidal thought.  She is hoping that she is able to keep her house however she has a plan if it did not go through then she is going to rent a house in the neighborhood.  Patient denies drinking or using any illegal substances.  She feels guilty not able to see her mother since January.  She is hoping to make  a trip very soon when she have money.  Suicidal Ideation: No Plan Formed: No Patient has means to carry out plan: No  Homicidal Ideation: No Plan Formed: No Patient has means to carry out plan: No  Past Psychiatric History/Hospitalization(s) Patient was referred from emergency room where she stayed overnight .  She has completed intensive outpatient program .  Patient has history of depression and anxiety symptoms for a long time.  She was seeing Dr. Evelene Croon for more than 12 years.  She was getting Xanax from her which was working okay until December of last year she decided to come off because her son was stealing her Xanax.  She has been on Prozac for a long time.  In the past she had tried Thorazine and Depakote but causes side effects and does not feel any improvement.   Anxiety: Yes Bipolar Disorder: No Depression: Yes Mania: No Psychosis: No Schizophrenia: No Personality Disorder: No Hospitalization for psychiatric illness: Patient was admitted to OBS unit. History of Electroconvulsive Shock Therapy: No Prior Suicide Attempts: No  Medical History; Patient has no active medical problem.  She has not seen her primary care physician in a while.  Review of Systems  Constitutional: Positive for weight loss.  Cardiovascular: Negative for chest pain and palpitations.  Musculoskeletal: Negative.   Neurological: Negative for dizziness and tremors.  Psychiatric/Behavioral: Negative.      Psychiatric: Agitation: No Hallucination: No Depressed Mood: No Insomnia: Yes Hypersomnia: No Altered Concentration: No Feels Worthless: No Grandiose Ideas: No Belief In Special Powers: No New/Increased Substance Abuse: No Compulsions: No  Neurologic: Headache: No Seizure: No Paresthesias: No    Outpatient Encounter Prescriptions as of 04/06/2016  Medication Sig  . FLUoxetine (PROZAC) 20 MG capsule Take 1 capsule (20 mg total) by mouth daily.  . hydrOXYzine (VISTARIL) 25 MG capsule TAKE  ONE CAPSULE BY MOUTH DAILY AS NEEDED FOR ANXIETY  . QUEtiapine (SEROQUEL) 100 MG tablet TAKE ONE-HALF TO ONE TABLET BY MOUTH AT BEDTIME  . [DISCONTINUED] FLUoxetine (PROZAC) 20 MG capsule Take 1 capsule (20 mg total) by mouth daily.  . [DISCONTINUED] QUEtiapine (SEROQUEL) 100 MG tablet TAKE ONE-HALF TO ONE TABLET BY MOUTH AT BEDTIME   No facility-administered encounter medications on file as of 04/06/2016.     No results found for this or any previous visit (from the past 2160 hour(s)).    Physical Exam: Constitutional:  BP 108/68 (BP Location: Left Arm, Patient Position: Sitting, Cuff Size: Normal)   Pulse 63   Ht 5\' 5"  (1.651 m)   Wt 118 lb 3.2 oz (53.6 kg)   BMI 19.67 kg/m   Musculoskeletal: Strength & Muscle Tone: within normal limits Gait & Station: normal Patient leans: N/A  Mental Status Examination;  Patient is casually dressed and fairly groomed. She is anxious but cooperative.  She feels tired but cooperative.  It appears that she has lost weight from the past.  Her energy level is fair.  Her speech is soft clear and coherent.  Her thought processes logical and goal-directed.  There were no tremors or shakes.  There were no paranoia, delusional or any obsessive thoughts.  Her psychomotor activity is normal.  Her fund of knowledge is good.  There were no flight of ideas or any loose association.  There were no tremors or shakes.  She is alert and oriented x3.  Her insight judgment and impulse control is okay.   Established Problem, Stable/Improving (1), Review of Psycho-Social Stressors (1), Review or order clinical lab tests (1), New Problem, with no additional work-up planned (3), Review of Last Therapy Session (1), Review of Medication Regimen & Side Effects (2) and Review of New Medication or Change in Dosage (2)  Assessment: Axis I: Maj. depressive disorder, recurrent Axis II: Deferred  Axis III:  Past Medical History:  Diagnosis Date  . Allergy   . Anxiety   .  Bipolar disorder (HCC)   . Headache(784.0)   . Substance abuse     Plan:  I discuss recent weight loss and lack of appetite.  Reinforce blood work and patient promised that she will do the blood work today.  I also encouraged to take Seroquel 100 mg every night which helps her sleep, result depression and appetite.  I would discontinue Vistaril since it is prohibited with the program that she is following with nursing board of [facility].  Continue Prozac 20 mg daily.  Recommended to call us back if she has any question or any concern.  Discuss safety plan that anytime having active suicidal thoughts or homicidal thoughts and she didn't call 911 or go to the local emergency room.  Discussed medication side effects.  Follow-up in 2 months.  This was a 30 minute visit and more  than 50% of the time spent in psychoeducation, counseling and correlation of care.  She was encouraged to use CBT technique for deep breathing and her lactation technique to deal with her stressors .  She was also recommended to do and if I hurt triggers and use her coping skills .     ARFEEN,SYED T., MD 04/06/2016

## 2016-06-01 ENCOUNTER — Encounter (HOSPITAL_COMMUNITY): Payer: Self-pay | Admitting: Psychiatry

## 2016-06-01 ENCOUNTER — Ambulatory Visit (INDEPENDENT_AMBULATORY_CARE_PROVIDER_SITE_OTHER): Payer: Self-pay | Admitting: Psychiatry

## 2016-06-01 VITALS — BP 118/68 | HR 66 | Ht 65.0 in | Wt 114.4 lb

## 2016-06-01 DIAGNOSIS — Z9889 Other specified postprocedural states: Secondary | ICD-10-CM

## 2016-06-01 DIAGNOSIS — Z811 Family history of alcohol abuse and dependence: Secondary | ICD-10-CM

## 2016-06-01 DIAGNOSIS — F321 Major depressive disorder, single episode, moderate: Secondary | ICD-10-CM

## 2016-06-01 DIAGNOSIS — Z8249 Family history of ischemic heart disease and other diseases of the circulatory system: Secondary | ICD-10-CM

## 2016-06-01 DIAGNOSIS — F17210 Nicotine dependence, cigarettes, uncomplicated: Secondary | ICD-10-CM

## 2016-06-01 DIAGNOSIS — Z79899 Other long term (current) drug therapy: Secondary | ICD-10-CM

## 2016-06-01 DIAGNOSIS — Z888 Allergy status to other drugs, medicaments and biological substances status: Secondary | ICD-10-CM

## 2016-06-01 DIAGNOSIS — Z813 Family history of other psychoactive substance abuse and dependence: Secondary | ICD-10-CM

## 2016-06-01 DIAGNOSIS — Z8261 Family history of arthritis: Secondary | ICD-10-CM

## 2016-06-01 MED ORDER — FLUOXETINE HCL 20 MG PO CAPS: 20.0000 mg | ORAL_CAPSULE | Freq: Every day | ORAL | 1 refills | Status: DC

## 2016-06-01 MED ORDER — QUETIAPINE FUMARATE 100 MG PO TABS: ORAL_TABLET | ORAL | 1 refills | Status: DC

## 2016-06-01 MED ORDER — BUSPIRONE HCL 5 MG PO TABS: 30.0000 mg | ORAL_TABLET | Freq: Two times a day (BID) | ORAL | 1 refills | Status: DC

## 2016-06-01 NOTE — Progress Notes (Signed)
BH MD/PA/NP OP Progress Note  06/01/2016 1:55 PM Cathlean CowerBrenda D Scallan  MRN:  [id number]  Chief Complaint:  Chief Complaint    Follow-up     Subjective:  I'm feeling anxious and nervous.  Holidays are too much for me.  HPI: Deborah Cobb came for her follow-up appointment.  She is taking her medication but endorsed increased anxiety and nervousness.  She was able to extend foreclosure for another month and now she is concerned about her next payment.  She admitted poor sleep and having anxiety attack when she thinks about her future.  Though she denies any suicidal thoughts or homicidal thoughts but admitted more isolated and withdrawn.  She has no news about her son and she does not want to associate with him anymore.  She denies any paranoia, hallucination, suicidal thoughts or homicidal thought.  She is thinking to get second job to help her bills.  She admitted some days not eating well and she continued to lose weight.  She admitted some time having nausea and lack of appetite but she is hoping things will get better after the holidays.  She had applied few job and hoping to get answer very soon.  Patient denies drinking alcohol or using any illegal substances.  She is compliant with the program from nursing board of [place] and she is trying to get weekly report about her medication intake.  Visit Diagnosis:    ICD-9-CM ICD-10-CM   1. Major depressive disorder, single episode, moderate (HCC) 296.22 F32.1 FLUoxetine (PROZAC) 20 MG capsule     QUEtiapine (SEROQUEL) 100 MG tablet     busPIRone (BUSPAR) 5 MG tablet    Past Psychiatric History: Reviewed.  Past Medical History:  Past Medical History:  Diagnosis Date  . Allergy   . Anxiety   . Bipolar disorder (HCC)   . Headache(784.0)   . Substance abuse     Past Surgical History:  Procedure Laterality Date  . CHOLECYSTECTOMY  06/2010  . TUBAL LIGATION      Family Psychiatric History: See below.  Family History:  Family History   Problem Relation Age of Onset  . Alcohol abuse Mother   . Heart disease Mother     A-fib/ CHF  . Alcohol abuse Father   . Atrial fibrillation Father   . COPD Father   . Myasthenia gravis Father   . Arthritis Brother   . Gout Brother   . Alcohol abuse Brother   . Depression Daughter   . Drug abuse Son     Social History:  Social History   Social History  . Marital status: Divorced    Spouse name: N/A  . Number of children: 2  . Years of education: BSN   Occupational History  . RN [employer]   Social History Main Topics  . Smoking status: Current Every Day Smoker    Packs/day: 1.00    Years: 20.00    Types: Cigarettes  . Smokeless tobacco: None  . Alcohol use No     Comment: once yearly  . Drug use: No     Comment: rarely  . Sexual activity: No   Other Topics Concern  . None   Social History Narrative  . None    Allergies:  Allergies  Allergen Reactions  . Ibuprofen     angioendemia     Metabolic Disorder Labs: No results found for: HGBA1C, MPG No results found for: PROLACTIN No results found for: CHOL, TRIG, HDL, CHOLHDL, VLDL, LDLCALC  Current Medications: Current Outpatient Prescriptions  Medication Sig Dispense Refill  . busPIRone (BUSPAR) 5 MG tablet Take 6 tablets (30 mg total) by mouth 2 (two) times daily. 60 tablet 1  . FLUoxetine (PROZAC) 20 MG capsule Take 1 capsule (20 mg total) by mouth daily. 30 capsule 1  . QUEtiapine (SEROQUEL) 100 MG tablet TAKE ONE-HALF TO ONE TABLET BY MOUTH AT BEDTIME 30 tablet 1   No current facility-administered medications for this visit.     Neurologic: Headache: No Seizure: No Paresthesias: No  Musculoskeletal: Strength & Muscle Tone: within normal limits Gait & Station: normal Patient leans: N/A  Psychiatric Specialty Exam: ROS  Blood pressure 118/68, pulse 66, height 5\' 5"  (1.651 m), weight 114 lb 6.4 oz (51.9 kg).Body mass index is 19.04 kg/m.  General Appearance: Casual  Eye Contact:   Good  Speech:  Normal Rate  Volume:  Normal  Mood:  Anxious  Affect:  Constricted  Thought Process:  Goal Directed  Orientation:  Full (Time, Place, and Person)  Thought Content: Rumination   Suicidal Thoughts:  No  Homicidal Thoughts:  No  Memory:  Immediate;   Good Recent;   Good Remote;   Good  Judgement:  Good  Insight:  Good  Psychomotor Activity:  Normal  Concentration:  Concentration: Good and Attention Span: Good  Recall:  Good  Fund of Knowledge: Good  Language: Good  Akathisia:  No  Handed:  Right  AIMS (if indicated):  None reported   Assets:  Communication Skills Desire for Improvement Resilience  ADL's:  Intact  Cognition: WNL  Sleep:  Fair      Assessment ; Deborah Cobb is a 55 year old Caucasian female who came for her follow-up appointment.  She has diagnoses of major depressive disorder, recurrent and anxiety disorder NOS.  Plan Patient is taking her Seroquel and Prozac as prescribed.  In the past we had tried increase Prozac but she developed tremors and shakes.  She also tried Vistaril but it cause restlessness.  Also Vistaril is prohibited with the program that she is following with nursing medical board of [place].  I recommended to try BuSpar to help her anxiety but she will confirmed from the program for us to see if she can take the BuSpar.  We discussed medication side effects and benefits.  We will try BuSpar 5 mg twice a day and continue Seroquel 1 mg at bedtime and Prozac 20 mg daily.  I also offered counseling but due to financial reasons patient does not want to see a therapist at this time.  Recommended to call us back if she has any question, concern if she feels worsening of the symptom.  Follow-up in 2 months.  Leah Skora T., MD 06/01/2016, 1:55 PM

## 2016-08-02 ENCOUNTER — Ambulatory Visit (INDEPENDENT_AMBULATORY_CARE_PROVIDER_SITE_OTHER): Payer: Self-pay | Admitting: Psychiatry

## 2016-08-02 ENCOUNTER — Encounter (HOSPITAL_COMMUNITY): Payer: Self-pay | Admitting: Psychiatry

## 2016-08-02 DIAGNOSIS — Z818 Family history of other mental and behavioral disorders: Secondary | ICD-10-CM

## 2016-08-02 DIAGNOSIS — Z9851 Tubal ligation status: Secondary | ICD-10-CM

## 2016-08-02 DIAGNOSIS — Z79899 Other long term (current) drug therapy: Secondary | ICD-10-CM

## 2016-08-02 DIAGNOSIS — F17210 Nicotine dependence, cigarettes, uncomplicated: Secondary | ICD-10-CM

## 2016-08-02 DIAGNOSIS — Z813 Family history of other psychoactive substance abuse and dependence: Secondary | ICD-10-CM

## 2016-08-02 DIAGNOSIS — Z8249 Family history of ischemic heart disease and other diseases of the circulatory system: Secondary | ICD-10-CM

## 2016-08-02 DIAGNOSIS — Z888 Allergy status to other drugs, medicaments and biological substances status: Secondary | ICD-10-CM

## 2016-08-02 DIAGNOSIS — Z9049 Acquired absence of other specified parts of digestive tract: Secondary | ICD-10-CM

## 2016-08-02 DIAGNOSIS — F321 Major depressive disorder, single episode, moderate: Secondary | ICD-10-CM

## 2016-08-02 DIAGNOSIS — Z8261 Family history of arthritis: Secondary | ICD-10-CM

## 2016-08-02 DIAGNOSIS — F419 Anxiety disorder, unspecified: Secondary | ICD-10-CM

## 2016-08-02 DIAGNOSIS — Z811 Family history of alcohol abuse and dependence: Secondary | ICD-10-CM

## 2016-08-02 MED ORDER — QUETIAPINE FUMARATE 100 MG PO TABS: ORAL_TABLET | ORAL | 2 refills | Status: DC

## 2016-08-02 MED ORDER — BUSPIRONE HCL 5 MG PO TABS: 5.0000 mg | ORAL_TABLET | Freq: Two times a day (BID) | ORAL | 2 refills | Status: DC

## 2016-08-02 MED ORDER — FLUOXETINE HCL 20 MG PO CAPS: 20.0000 mg | ORAL_CAPSULE | Freq: Every day | ORAL | 2 refills | Status: DC

## 2016-08-02 NOTE — Progress Notes (Signed)
BH MD/PA/NP OP Progress Note  08/02/2016 2:59 PM Deborah Cobb  MRN:  [id number]  Chief Complaint:  Subjective:  I'm doing fine.  I like BuSpar.  HPI: Deborah Cobb came for her follow-up appointment.  She is feeling better since she started taking BuSpar.  She sleeping good with Seroquel.  She is pleased that she gained weight from the past.  She had a quiet Christmas.  She has no contact with her son who is troublemaking.  Patient continues to work as Station.  She continue to stress about finances but she is making money working at gas station.  Her appetite is improved from the past.  She denies any major panic attack since taking the BuSpar.  She has no tremors, shakes or any side effects.  She denies any suicidal thoughts or homicidal thought.  She is more hopeful denies any crying spells.  Patient denies drinking alcohol or using any illegal substances.  She is compliant with the program from nursing board of [place] and trying to get them weekly report about her medication intake.  Her energy level is improved from the past.  Visit Diagnosis:    ICD-9-CM ICD-10-CM   1. Major depressive disorder, single episode, moderate (HCC) 296.22 F32.1 QUEtiapine (SEROQUEL) 100 MG tablet     FLUoxetine (PROZAC) 20 MG capsule     busPIRone (BUSPAR) 5 MG tablet    Past Psychiatric History: Reviewed.  Past Medical History:  Past Medical History:  Diagnosis Date  . Allergy   . Anxiety   . Bipolar disorder (HCC)   . Headache(784.0)   . Substance abuse     Past Surgical History:  Procedure Laterality Date  . CHOLECYSTECTOMY  06/2010  . TUBAL LIGATION      Family Psychiatric History: Reviewed.  Family History:  Family History  Problem Relation Age of Onset  . Alcohol abuse Mother   . Heart disease Mother     A-fib/ CHF  . Alcohol abuse Father   . Atrial fibrillation Father   . COPD Father   . Myasthenia gravis Father   . Arthritis Brother   . Gout Brother   . Alcohol abuse Brother    . Depression Daughter   . Drug abuse Son     Social History:  Social History   Social History  . Marital status: Divorced    Spouse name: N/A  . Number of children: 2  . Years of education: BSN   Occupational History  . RN [employer]   Social History Main Topics  . Smoking status: Current Every Day Smoker    Packs/day: 1.00    Years: 30.00    Types: Cigarettes  . Smokeless tobacco: Never Used  . Alcohol use No     Comment: once yearly  . Drug use: No     Comment: rarely  . Sexual activity: No   Other Topics Concern  . None   Social History Narrative  . None    Allergies:  Allergies  Allergen Reactions  . Ibuprofen     angioendemia     Metabolic Disorder Labs: No results found for: HGBA1C, MPG No results found for: PROLACTIN No results found for: CHOL, TRIG, HDL, CHOLHDL, VLDL, LDLCALC   Current Medications: Current Outpatient Prescriptions  Medication Sig Dispense Refill  . busPIRone (BUSPAR) 5 MG tablet Take 6 tablets (30 mg total) by mouth 2 (two) times daily. 60 tablet 1  . FLUoxetine (PROZAC) 20 MG capsule Take 1 capsule (20 mg  total) by mouth daily. 30 capsule 1  . QUEtiapine (SEROQUEL) 100 MG tablet TAKE ONE-HALF TO ONE TABLET BY MOUTH AT BEDTIME 30 tablet 1   No current facility-administered medications for this visit.     Neurologic: Headache: No Seizure: No Paresthesias: No  Musculoskeletal: Strength & Muscle Tone: within normal limits Gait & Station: normal Patient leans: N/A  Psychiatric Specialty Exam: ROS  Blood pressure 106/64, pulse 60, height 5\' 5"  (1.651 m), weight 117 lb (53.1 kg).Body mass index is 19.47 kg/m.  General Appearance: Casual  Eye Contact:  Good  Speech:  Clear and Coherent  Volume:  Normal  Mood:  Anxious  Affect:  Congruent  Thought Process:  Goal Directed  Orientation:  Full (Time, Place, and Person)  Thought Content: WDL and Logical   Suicidal Thoughts:  No  Homicidal Thoughts:  No  Memory:   Immediate;   Good Recent;   Good Remote;   Good  Judgement:  Good  Insight:  Good  Psychomotor Activity:  EPS  Concentration:  Concentration: Good and Attention Span: Fair  Recall:  Good  Fund of Knowledge: Good  Language: Good  Akathisia:  No  Handed:  Right  AIMS (if indicated):  0  Assets:  Communication Skills Desire for Improvement Resilience  ADL's:  Intact  Cognition: WNL  Sleep:  ok   Assessment: Major depressive disorder, recurrent.  Anxiety disorder NOS  Plan: Patient doing better on her current psychiatric medication.  Continue BuSpar 5 mg twice a day.  It is helping her anxiety and nervousness.  Continue Seroquel 100 mg at bedtime and Prozac 20 mg daily.  I offer counseling but patient declined due to financial reasons.  Follow-up in 3 months. Discussed medication side effects and benefits.  Recommended to call us back if there is any question, concern or worsening of the symptoms.  Discuss safety plan that anytime having active suicidal thoughts or homicidal thoughts and she need to call 911 or go to the local emergency room. Dayle Mcnerney T., MD 08/02/2016, 2:59 PM

## 2016-11-01 ENCOUNTER — Ambulatory Visit (HOSPITAL_COMMUNITY): Payer: Self-pay | Admitting: Psychiatry

## 2016-11-28 ENCOUNTER — Other Ambulatory Visit (HOSPITAL_COMMUNITY): Payer: Self-pay

## 2016-11-28 DIAGNOSIS — F321 Major depressive disorder, single episode, moderate: Secondary | ICD-10-CM

## 2016-11-28 MED ORDER — FLUOXETINE HCL 20 MG PO CAPS: 20.0000 mg | ORAL_CAPSULE | Freq: Every day | ORAL | 2 refills | Status: DC

## 2016-12-28 ENCOUNTER — Ambulatory Visit (HOSPITAL_COMMUNITY): Payer: Self-pay | Admitting: Psychiatry

## 2017-01-02 ENCOUNTER — Other Ambulatory Visit (HOSPITAL_COMMUNITY): Payer: Self-pay

## 2017-01-02 DIAGNOSIS — F321 Major depressive disorder, single episode, moderate: Secondary | ICD-10-CM

## 2017-01-02 MED ORDER — QUETIAPINE FUMARATE 100 MG PO TABS: ORAL_TABLET | ORAL | 0 refills | Status: DC

## 2017-01-02 MED ORDER — BUSPIRONE HCL 5 MG PO TABS: 5.0000 mg | ORAL_TABLET | Freq: Two times a day (BID) | ORAL | 0 refills | Status: DC

## 2017-01-17 ENCOUNTER — Encounter (HOSPITAL_COMMUNITY): Payer: Self-pay | Admitting: Psychiatry

## 2017-01-17 ENCOUNTER — Ambulatory Visit (INDEPENDENT_AMBULATORY_CARE_PROVIDER_SITE_OTHER): Payer: Self-pay | Admitting: Psychiatry

## 2017-01-17 DIAGNOSIS — F17210 Nicotine dependence, cigarettes, uncomplicated: Secondary | ICD-10-CM

## 2017-01-17 DIAGNOSIS — Z811 Family history of alcohol abuse and dependence: Secondary | ICD-10-CM

## 2017-01-17 DIAGNOSIS — F321 Major depressive disorder, single episode, moderate: Secondary | ICD-10-CM

## 2017-01-17 DIAGNOSIS — Z813 Family history of other psychoactive substance abuse and dependence: Secondary | ICD-10-CM

## 2017-01-17 DIAGNOSIS — Z818 Family history of other mental and behavioral disorders: Secondary | ICD-10-CM

## 2017-01-17 MED ORDER — BUSPIRONE HCL 5 MG PO TABS: 5.0000 mg | ORAL_TABLET | Freq: Two times a day (BID) | ORAL | 2 refills | Status: DC

## 2017-01-17 MED ORDER — QUETIAPINE FUMARATE 100 MG PO TABS: ORAL_TABLET | ORAL | 2 refills | Status: DC

## 2017-01-17 MED ORDER — FLUOXETINE HCL 20 MG PO CAPS: 20.0000 mg | ORAL_CAPSULE | Freq: Every day | ORAL | 2 refills | Status: DC
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
## 2017-04-19 ENCOUNTER — Ambulatory Visit (HOSPITAL_COMMUNITY): Payer: Self-pay | Admitting: Psychiatry

## 2017-07-12 ENCOUNTER — Other Ambulatory Visit (HOSPITAL_COMMUNITY): Payer: Self-pay

## 2017-07-12 ENCOUNTER — Telehealth (HOSPITAL_COMMUNITY): Payer: Self-pay

## 2017-07-12 DIAGNOSIS — F321 Major depressive disorder, single episode, moderate: Secondary | ICD-10-CM

## 2017-07-12 NOTE — Telephone Encounter (Signed)
Patient is calling for a refill on her Prozac, she has missed her last 2 appointments and rescheduled for 3/5 - she does not need the other 2 medications, only Prozac. Please review and advise, thank you

## 2017-07-12 NOTE — Telephone Encounter (Signed)
We can give only 30-day supply.  She should see the physician within 30 days for future refills.  No more future refills until she see the physician.

## 2017-07-13 MED ORDER — FLUOXETINE HCL 20 MG PO CAPS: 20.0000 mg | ORAL_CAPSULE | Freq: Every day | ORAL | 0 refills | Status: DC

## 2017-08-07 ENCOUNTER — Ambulatory Visit (HOSPITAL_COMMUNITY): Payer: Self-pay | Admitting: Psychiatry

## 2017-08-15 ENCOUNTER — Ambulatory Visit (HOSPITAL_COMMUNITY): Payer: Self-pay | Admitting: Psychiatry

## 2017-12-04 ENCOUNTER — Ambulatory Visit (INDEPENDENT_AMBULATORY_CARE_PROVIDER_SITE_OTHER): Payer: Self-pay | Admitting: Psychiatry

## 2017-12-04 ENCOUNTER — Encounter (HOSPITAL_COMMUNITY): Payer: Self-pay | Admitting: Psychiatry

## 2017-12-04 VITALS — BP 128/74 | HR 72 | Ht 65.0 in | Wt 146.0 lb

## 2017-12-04 DIAGNOSIS — F321 Major depressive disorder, single episode, moderate: Secondary | ICD-10-CM

## 2017-12-04 MED ORDER — FLUOXETINE HCL 20 MG PO CAPS: 20.0000 mg | ORAL_CAPSULE | Freq: Every day | ORAL | 0 refills | Status: DC

## 2017-12-04 MED ORDER — BUSPIRONE HCL 5 MG PO TABS
5.0000 mg | ORAL_TABLET | Freq: Two times a day (BID) | ORAL | 0 refills | Status: DC
Start: 2017-12-04 — End: 2018-01-10

## 2017-12-04 MED ORDER — TRAZODONE HCL 50 MG PO TABS: 50.0000 mg | ORAL_TABLET | Freq: Every evening | ORAL | 0 refills | Status: DC | PRN
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
## 2018-01-03 ENCOUNTER — Other Ambulatory Visit (HOSPITAL_COMMUNITY): Payer: Self-pay

## 2018-01-03 DIAGNOSIS — F321 Major depressive disorder, single episode, moderate: Secondary | ICD-10-CM

## 2018-01-03 MED ORDER — FLUOXETINE HCL 20 MG PO CAPS: 20.0000 mg | ORAL_CAPSULE | Freq: Every day | ORAL | 0 refills | Status: DC

## 2018-01-10 ENCOUNTER — Other Ambulatory Visit (HOSPITAL_COMMUNITY): Payer: Self-pay

## 2018-01-10 DIAGNOSIS — F321 Major depressive disorder, single episode, moderate: Secondary | ICD-10-CM

## 2018-01-10 MED ORDER — BUSPIRONE HCL 5 MG PO TABS: 5.0000 mg | ORAL_TABLET | Freq: Two times a day (BID) | ORAL | 0 refills | Status: DC

## 2018-01-10 MED ORDER — FLUOXETINE HCL 20 MG PO CAPS: 20.0000 mg | ORAL_CAPSULE | Freq: Every day | ORAL | 0 refills | Status: DC

## 2018-01-10 MED ORDER — TRAZODONE HCL 50 MG PO TABS: 50.0000 mg | ORAL_TABLET | Freq: Every evening | ORAL | 0 refills | Status: DC | PRN

## 2018-01-15 ENCOUNTER — Ambulatory Visit (HOSPITAL_COMMUNITY): Payer: Self-pay | Admitting: Psychiatry

## 2018-01-18 ENCOUNTER — Ambulatory Visit (HOSPITAL_COMMUNITY): Payer: Self-pay | Admitting: Licensed Clinical Social Worker

## 2018-03-05 ENCOUNTER — Ambulatory Visit (HOSPITAL_COMMUNITY): Payer: Self-pay | Admitting: Psychiatry

## 2018-04-12 ENCOUNTER — Other Ambulatory Visit (HOSPITAL_COMMUNITY): Payer: Self-pay

## 2018-04-12 DIAGNOSIS — F321 Major depressive disorder, single episode, moderate: Secondary | ICD-10-CM

## 2018-04-12 MED ORDER — BUSPIRONE HCL 5 MG PO TABS: 5.0000 mg | ORAL_TABLET | Freq: Two times a day (BID) | ORAL | 0 refills | Status: AC

## 2018-04-12 MED ORDER — TRAZODONE HCL 50 MG PO TABS: 50.0000 mg | ORAL_TABLET | Freq: Every evening | ORAL | 0 refills | Status: AC | PRN

## 2018-04-12 MED ORDER — FLUOXETINE HCL 20 MG PO CAPS: 20.0000 mg | ORAL_CAPSULE | Freq: Every day | ORAL | 0 refills | Status: AC

## 2018-04-26 ENCOUNTER — Ambulatory Visit (HOSPITAL_COMMUNITY): Payer: Self-pay | Admitting: Psychiatry

## 2019-05-10 ENCOUNTER — Emergency Department (HOSPITAL_COMMUNITY)
Admission: EM | Admit: 2019-05-10 | Discharge: 2019-05-10 | Disposition: A | Discharge status: Home / Self Care | Payer: Self-pay | Attending: Emergency Medicine | Admitting: Emergency Medicine

## 2019-05-10 ENCOUNTER — Encounter (HOSPITAL_COMMUNITY): Payer: Self-pay

## 2019-05-10 ENCOUNTER — Other Ambulatory Visit: Payer: Self-pay

## 2019-05-10 DIAGNOSIS — F17210 Nicotine dependence, cigarettes, uncomplicated: Secondary | ICD-10-CM | POA: Insufficient documentation

## 2019-05-10 DIAGNOSIS — Z79899 Other long term (current) drug therapy: Secondary | ICD-10-CM | POA: Insufficient documentation

## 2019-05-10 DIAGNOSIS — J019 Acute sinusitis, unspecified: Secondary | ICD-10-CM | POA: Insufficient documentation

## 2019-05-10 DIAGNOSIS — B9689 Other specified bacterial agents as the cause of diseases classified elsewhere: Secondary | ICD-10-CM | POA: Insufficient documentation

## 2019-05-10 MED ORDER — AMOXICILLIN-POT CLAVULANATE 875-125 MG PO TABS: 1.0000 | ORAL_TABLET | Freq: Two times a day (BID) | ORAL | 0 refills | Status: AC

## 2019-05-10 MED ORDER — PROMETHAZINE-DM 6.25-15 MG/5ML PO SYRP: 5.0000 mL | ORAL_SOLUTION | Freq: Four times a day (QID) | ORAL | 0 refills | Status: AC | PRN

## 2019-05-10 MED ORDER — PROMETHAZINE-DM 6.25-15 MG/5ML PO SYRP: 5.0000 mL | ORAL_SOLUTION | Freq: Four times a day (QID) | ORAL | 0 refills | Status: DC | PRN

## 2019-05-10 MED ORDER — AMOXICILLIN-POT CLAVULANATE 875-125 MG PO TABS: 1.0000 | ORAL_TABLET | Freq: Two times a day (BID) | ORAL | 0 refills | Status: DC

## 2019-05-10 NOTE — ED Triage Notes (Signed)
Pt coming in productive cough with thin yellow mucous x1 week and ear pain x3 days (right is worse than left). Also c/o shortness of breath when laying down. Fever of 100.5 at home and took day &night cough medication

## 2019-05-10 NOTE — ED Provider Notes (Signed)
[facility]-EMERGENCY DEPT Provider Note   CSN: [id number] Arrival date & time: 05/10/19  0509     History   Chief Complaint Chief Complaint  Patient presents with  . Cough  . Otalgia    HPI Deborah Cobb is a 58 y.o. female with a history of major depressive disorder who presents to the emergency department with a chief complaint of cough.  The patient reports that she began having sinus pain and pressure, headache, and nasal congestion approximately 10 days ago.  She reports that she has since developed a nonproductive cough that is worse at night and when she attempts to lay flat.  She reports associated posttussive emesis since yesterday.  She reports that she is also been having more dyspnea with exertion over the last 2 days.  She also notes that she develops a sore in the floor of her mouth over the last few days.  She reports that the sinus pain and pressure seems to have somewhat improved, but she is now having severe bilateral ear pain.  She also endorses a sore throat that is worse in the morning and postnasal drip.  She also reports that her voice has been hoarse over the last few days, but this is since improved.  She checked her temperature at home last night and had an oral temperature 100.5.  She denies chills, chest pain, visual changes, loss of sense of taste or smell, vomiting not associated with coughing, nausea, diarrhea, sore throat, leg swelling, abdominal pain, back pain, palpitations.   She is a current, every day smoker.  No treatment prior to arrival.  No known or suspected COVID-19 contacts.  She has been taking over-the-counter cough medication.     The history is provided by the patient. No language interpreter was used.    Past Medical History:  Diagnosis Date  . Allergy   . Anxiety   . Bipolar disorder (HCC)   . Headache(784.0)   . Substance abuse [facility])     Patient Active Problem List   Diagnosis Date Noted  . MDD (major  depressive disorder) 12/22/2013  . Suicidal ideation 12/22/2013  . Anxiety 12/06/2012  . Memory loss 12/06/2012  . CHOLECYSTITIS, CHRONIC 04/08/2010  . ABDOMINAL PAIN, RIGHT UPPER QUADRANT 03/24/2010  . CHEST PAIN UNSPECIFIED 02/22/2010  . DYSPHAGIA UNSPECIFIED 02/22/2010    Past Surgical History:  Procedure Laterality Date  . CHOLECYSTECTOMY  06/2010  . TUBAL LIGATION       OB History   No obstetric history on file.      Home Medications    Prior to Admission medications   Medication Sig Start Date End Date Taking? Authorizing Provider  amoxicillin-clavulanate (AUGMENTIN) 875-125 MG tablet Take 1 tablet by mouth 2 (two) times daily for 10 days. 05/10/19 05/20/19  Lovetta Condie A, PA-C  FLUoxetine (PROZAC) 20 MG capsule Take 1 capsule (20 mg total) by mouth daily. 04/12/18 04/12/19  Arfeen, Phillips Grout, MD  promethazine-dextromethorphan (PROMETHAZINE-DM) 6.25-15 MG/5ML syrup Take 5 mLs by mouth 4 (four) times daily as needed for cough. 05/10/19   Orilla Templeman A, PA-C  traZODone (DESYREL) 50 MG tablet Take 1 tablet (50 mg total) by mouth at bedtime as needed for sleep. 04/12/18   Cleotis Nipper, MD    Family History Family History  Problem Relation Age of Onset  . Alcohol abuse Mother   . Heart disease Mother        A-fib/ CHF  . Alcohol abuse Father   .  Atrial fibrillation Father   . COPD Father   . Myasthenia gravis Father   . Arthritis Brother   . Gout Brother   . Alcohol abuse Brother   . Depression Daughter   . Drug abuse Son     Social History Social History   Tobacco Use  . Smoking status: Current Every Day Smoker    Packs/day: 1.00    Years: 30.00    Pack years: 30.00    Types: Cigarettes  . Smokeless tobacco: Never Used  Substance Use Topics  . Alcohol use: Yes    Alcohol/week: 0.0 standard drinks    Comment: once a week  . Drug use: No    Types: Marijuana    Comment: everyday     Allergies   Ibuprofen   Review of Systems Review of Systems   Constitutional: Positive for fever. Negative for activity change and chills.  HENT: Positive for congestion, ear pain, postnasal drip, rhinorrhea, sinus pressure, sinus pain, sore throat and voice change. Negative for dental problem, drooling, facial swelling, mouth sores and trouble swallowing.   Eyes: Negative for visual disturbance.  Respiratory: Positive for cough and shortness of breath. Negative for chest tightness.   Cardiovascular: Negative for chest pain, palpitations and leg swelling.  Gastrointestinal: Positive for vomiting (post-tussive). Negative for abdominal pain, diarrhea and nausea.  Genitourinary: Negative for dysuria.  Musculoskeletal: Negative for back pain.  Skin: Negative for rash.  Allergic/Immunologic: Negative for immunocompromised state.  Neurological: Negative for weakness, numbness and headaches.  Psychiatric/Behavioral: Negative for confusion.     Physical Exam Updated Vital Signs BP (!) 179/95 (BP Location: Right Arm)   Pulse 75   Temp 98.2 F (36.8 C) (Oral)   Resp 18   Ht  (1.676 m)   Wt 70.3 kg   SpO2 95%   BMI 25.02 kg/m   Physical Exam Vitals signs and nursing note reviewed.  Constitutional:      General: She is not in acute distress.    Appearance: She is not ill-appearing, toxic-appearing or diaphoretic.     Comments: Well-appearing.  No acute distress.  HENT:     Head: Normocephalic.     Comments: Tender to palpation over the left frontal sinus and right maxillary sinus.  Bilateral purulent effusions to the TMs.  No mastoid tenderness bilaterally.    Nose:     Right Turbinates: Swollen.     Left Turbinates: Swollen.     Mouth/Throat:     Pharynx: Oropharynx is clear. Uvula midline. Posterior oropharyngeal erythema present. No pharyngeal swelling, oropharyngeal exudate or uvula swelling.  Eyes:     Conjunctiva/sclera: Conjunctivae normal.  Neck:     Musculoskeletal: Neck supple.  Cardiovascular:     Rate and Rhythm: Normal rate  and regular rhythm.     Heart sounds: No murmur. No friction rub. No gallop.   Pulmonary:     Effort: Pulmonary effort is normal. No respiratory distress.     Breath sounds: No stridor. No wheezing or rhonchi.     Comments: Scattered end expiratory wheezes in the bilateral apices.  Lungs are otherwise clear to auscultation bilaterally with good air movement throughout.  No increased work of breathing. Chest:     Chest wall: No tenderness.  Abdominal:     General: There is no distension.     Palpations: Abdomen is soft.  Skin:    General: Skin is warm.     Findings: No rash.  Neurological:     Mental  Status: She is alert.  Psychiatric:        Behavior: Behavior normal.      ED Treatments / Results  Labs (all labs ordered are listed, but only abnormal results are displayed) Labs Reviewed - No data to display  EKG None  Radiology No results found.  Procedures Procedures (including critical care time)  Medications Ordered in ED Medications - No data to display   Initial Impression / Assessment and Plan / ED Course  I have reviewed the triage vital signs and the nursing notes.  Pertinent labs & imaging results that were available during my care of the patient were reviewed by me and considered in my medical decision making (see chart for details).        89-year-old female with history of major depressive disorder presenting with 10 days of URI symptoms that predominantly began with sinus pain and pressure and headache.  Her chief complaints are now severe dry cough that has caused posttussive emesis and bilateral otalgia.  On exam, she has scattered end expiratory wheezes in the bilateral apices, but otherwise lungs are clear to auscultation bilaterally.  No rhonchi or rales.  She has purulent effusions of the bilateral TMs, but no mastoid tenderness.  She is also tender to palpation over the right maxillary sinus and left frontal sinus.  Given the length of her symptoms,  I think it is reasonable to treat her for acute bacterial sinusitis.  Low suspicion for COVID-19 at this time.  She was ambulated by me on pulse ox and maintained oxygen saturation of 95 to 96%.  She has an albuterol inhaler at home, which I have advised her to use for cough and shortness of breath.  She is also be given a referral for follow-up to her primary care provider.  Low suspicion for bacterial pneumonia at this time.  She is hemodynamically stable and in no acute distress.  Safe for discharge home with outpatient follow-up as indicated.  Final Clinical Impressions(s) / ED Diagnoses   Final diagnoses:  Acute bacterial sinusitis    ED Discharge Orders         Ordered    amoxicillin-clavulanate (AUGMENTIN) 875-125 MG tablet  2 times daily,   Status:  Discontinued     05/10/19 0609    promethazine-dextromethorphan (PROMETHAZINE-DM) 6.25-15 MG/5ML syrup  4 times daily PRN,   Status:  Discontinued     05/10/19 0609    amoxicillin-clavulanate (AUGMENTIN) 875-125 MG tablet  2 times daily     05/10/19 0614    promethazine-dextromethorphan (PROMETHAZINE-DM) 6.25-15 MG/5ML syrup  4 times daily PRN     05/10/19 0614           Joline Maxcy A, PA-C 05/10/19 0818    Molpus, John, MD 05/10/19 2238

## 2019-05-10 NOTE — Discharge Instructions (Signed)
Thank you for allowing me to care for you today in the Emergency Department.   Take 1 tablet of Augmentin 2 times daily for the next 10 days.  Make sure to complete the entire course even if your symptoms improve.  Use 2 puffs of albuterol inhaler every 4 hours as needed for shortness of breath or coughing episodes.  I would recommend trying to use this before you go to bed since your coughing is worse at night.  You can try using sinus rinses in the evening to decrease the amount of congestion in your nose which may also help your coughing.  Consider smoking cessation as this can put you at risk for having a longer time to recover from illnesses in addition to impairing her lung function over time.  For the sore in your mouth, you can swish and spit with warm salt water every 6 hours.  If your symptoms do not significantly improve in the next week, call the clinic above to get established with primary care.  Take 650 mg of Tylenol or 600 mg of ibuprofen with food every 6 hours for fever.  You can alternate between these 2 medications every 3 hours if your pain returns.  For instance, you can take Tylenol at noon, followed by a dose of ibuprofen at 3, followed by second dose of Tylenol and 6.   Return to the emergency department if you develop respiratory distress, chest pain, high fevers despite taking Tylenol and ibuprofen, changes in vision, vomiting, or other new, concerning symptoms.

## 2019-08-29 ENCOUNTER — Emergency Department (HOSPITAL_COMMUNITY)
Admission: EM | Admit: 2019-08-29 | Discharge: 2019-08-29 | Disposition: A | Discharge status: Home / Self Care | Payer: Self-pay | Attending: Emergency Medicine | Admitting: Emergency Medicine

## 2019-08-29 ENCOUNTER — Emergency Department (HOSPITAL_COMMUNITY): Payer: Self-pay

## 2019-08-29 ENCOUNTER — Other Ambulatory Visit: Payer: Self-pay

## 2019-08-29 ENCOUNTER — Encounter (HOSPITAL_COMMUNITY): Payer: Self-pay | Admitting: Emergency Medicine

## 2019-08-29 DIAGNOSIS — S20211A Contusion of right front wall of thorax, initial encounter: Secondary | ICD-10-CM | POA: Insufficient documentation

## 2019-08-29 DIAGNOSIS — S60222A Contusion of left hand, initial encounter: Secondary | ICD-10-CM | POA: Insufficient documentation

## 2019-08-29 DIAGNOSIS — Y929 Unspecified place or not applicable: Secondary | ICD-10-CM | POA: Insufficient documentation

## 2019-08-29 DIAGNOSIS — Y999 Unspecified external cause status: Secondary | ICD-10-CM | POA: Insufficient documentation

## 2019-08-29 DIAGNOSIS — F0781 Postconcussional syndrome: Secondary | ICD-10-CM | POA: Insufficient documentation

## 2019-08-29 DIAGNOSIS — Y939 Activity, unspecified: Secondary | ICD-10-CM | POA: Insufficient documentation

## 2019-08-29 DIAGNOSIS — S0990XA Unspecified injury of head, initial encounter: Secondary | ICD-10-CM | POA: Insufficient documentation

## 2019-08-29 DIAGNOSIS — T71194A Asphyxiation due to mechanical threat to breathing due to other causes, undetermined, initial encounter: Secondary | ICD-10-CM | POA: Insufficient documentation

## 2019-08-29 DIAGNOSIS — F17210 Nicotine dependence, cigarettes, uncomplicated: Secondary | ICD-10-CM | POA: Insufficient documentation

## 2019-08-29 NOTE — ED Notes (Signed)
Pt verbalized dc instructions and follow up care. Alert and ambulatory. No iv. 
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
## 2021-05-14 IMAGING — CT CT HEAD W/O CM
3 series · 16 of 47 positions shown, 19 images · non-contrast
Comparison: None.

CLINICAL DATA: Subacute head trauma with cognitive decline

EXAM:
CT HEAD WITHOUT CONTRAST
TECHNIQUE: Contiguous axial images were obtained from the base of the skull
through the vertex without intravenous contrast.

[Series 2: head wo · axial · 0.47mm/px · z∈[-60,+70]mm · 10 of 32 slices shown, 13 images]
[im 3/32  brain]
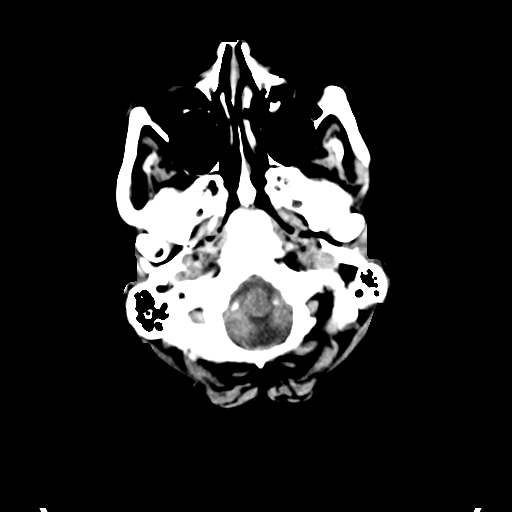
[im 3/32  bone]
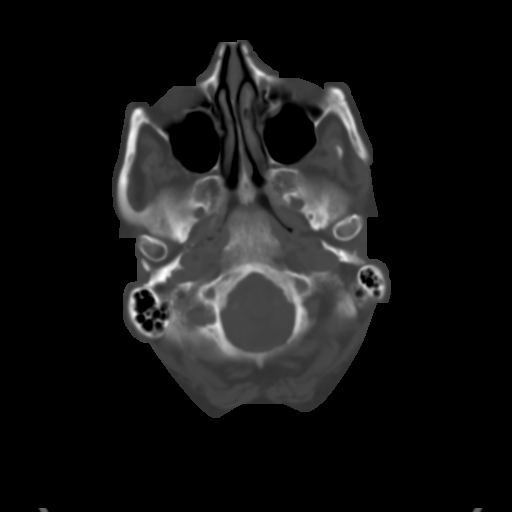
[im 6/32  brain]
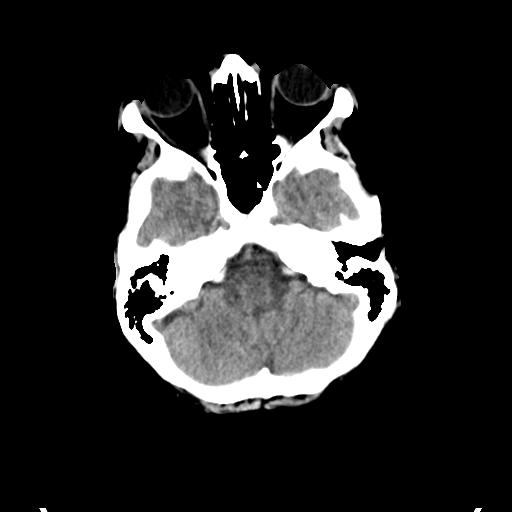
[im 9/32  brain]
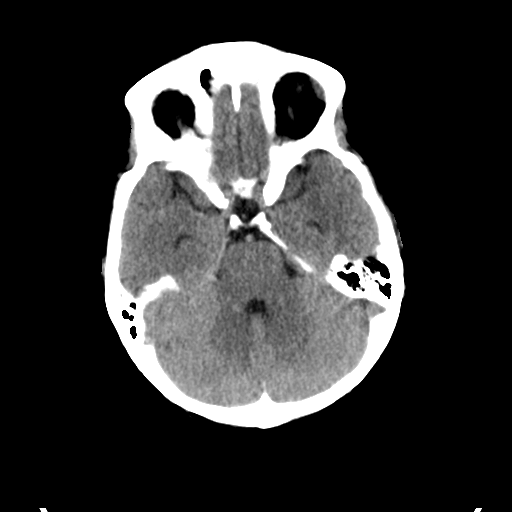
[im 11/32  brain]
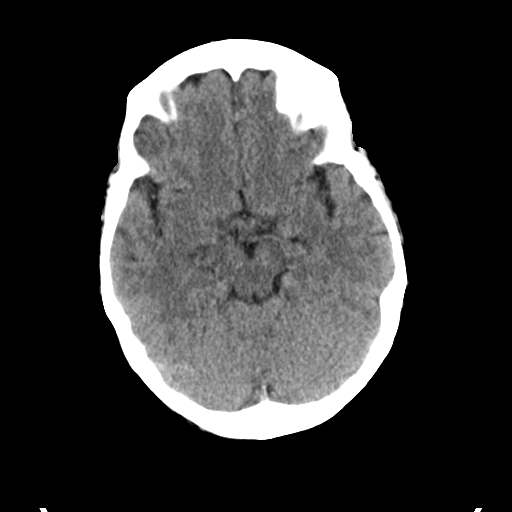
[im 14/32  brain]
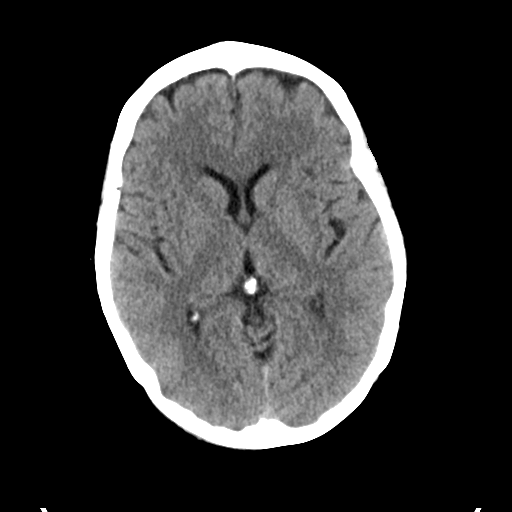
[im 14/32  bone]
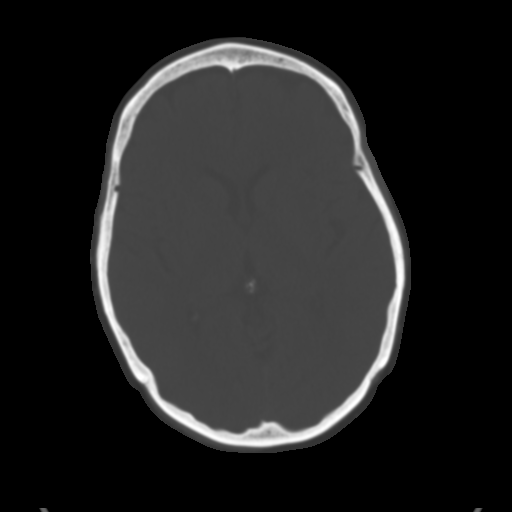
[im 18/32  brain]
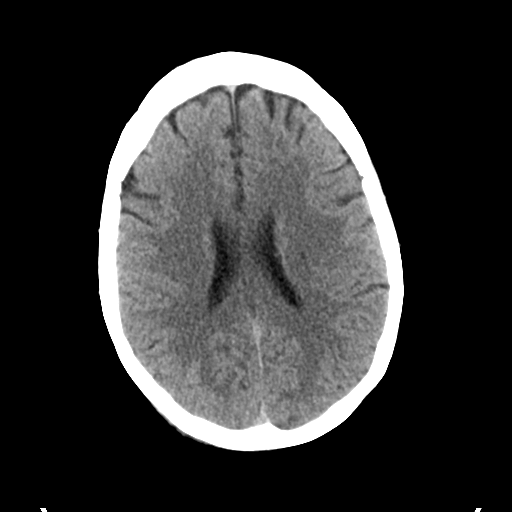
[im 21/32  brain]
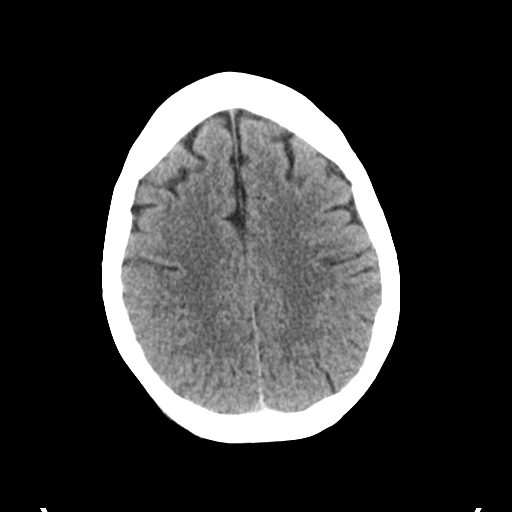
[im 24/32  brain]
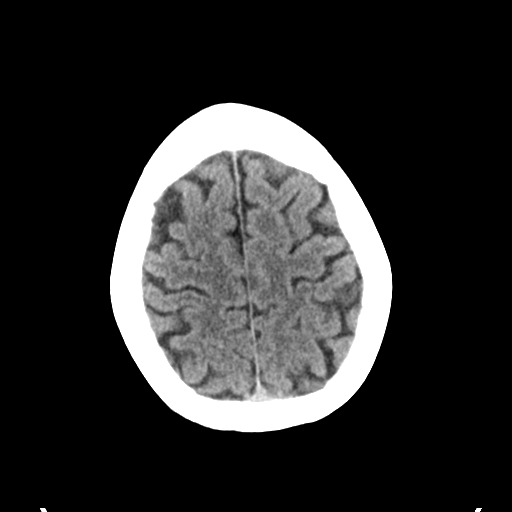
[im 26/32  brain]
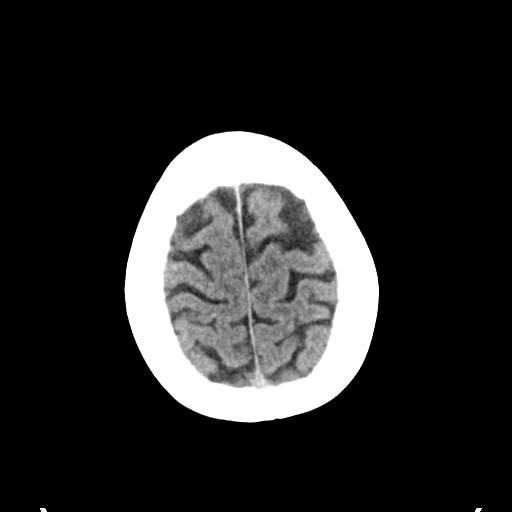
[im 26/32  bone]
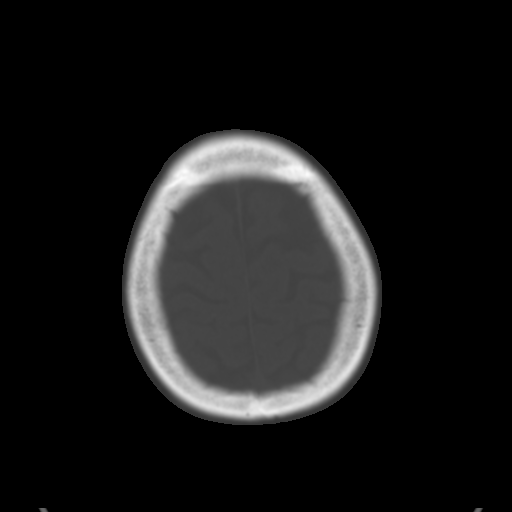
[im 29/32  brain]
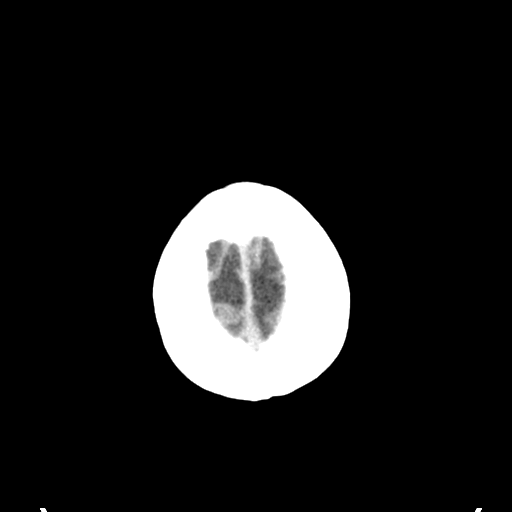

[Series 4: coronal soft tissue · coronal · 0.29mm/px · 3 of 67 slices shown]
[im 23/67  brain]
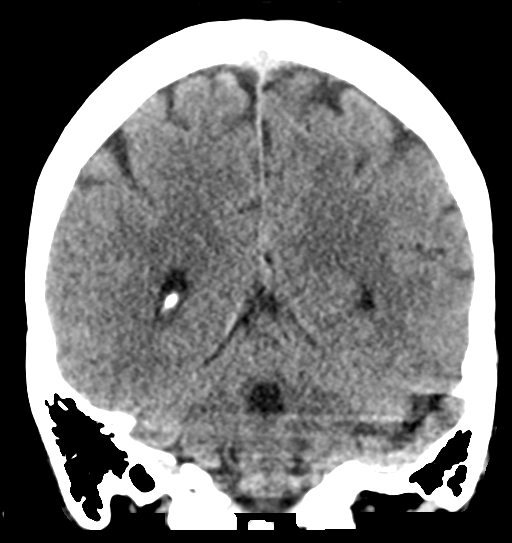
[im 30/67  brain]
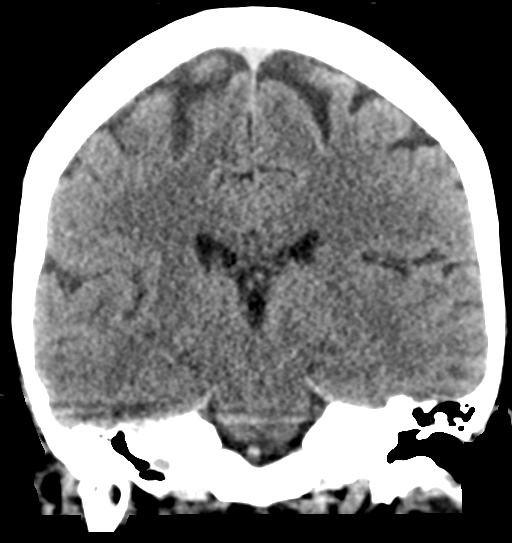
[im 37/67  brain]
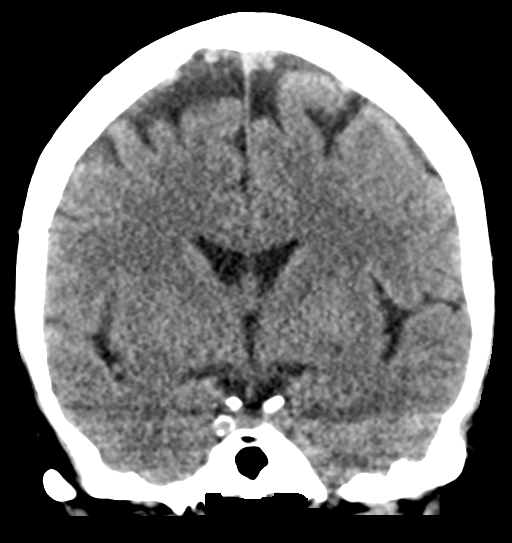

[Series 5: sagittal soft tissue · sagittal · 0.31mm/px · 3 of 49 slices shown]
[im 17/49  brain]
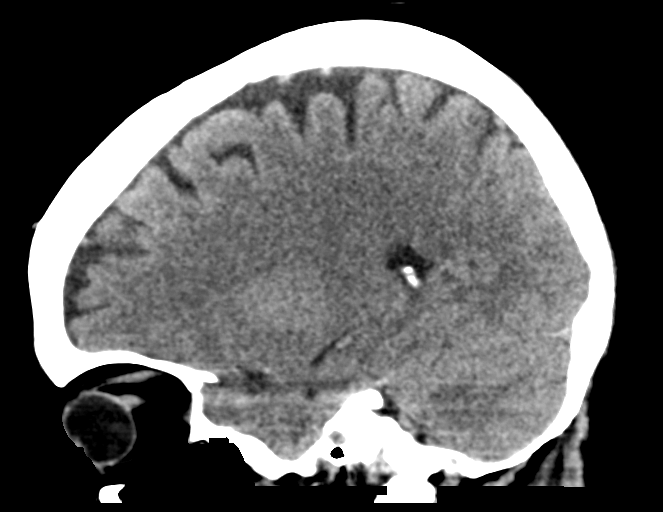
[im 25/49  brain]
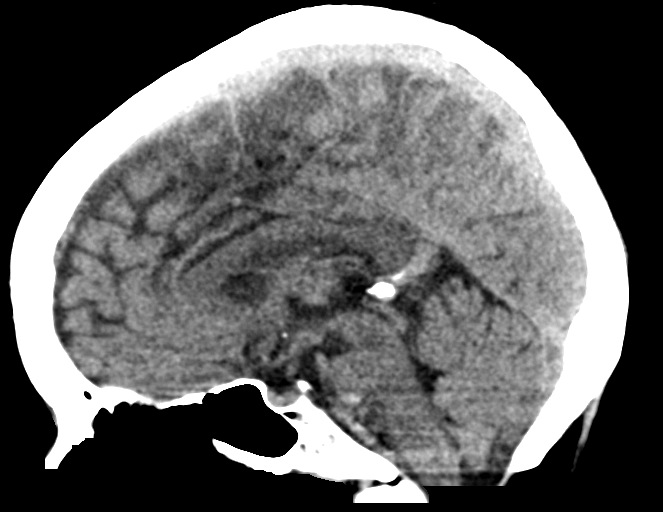
[im 33/49  brain]
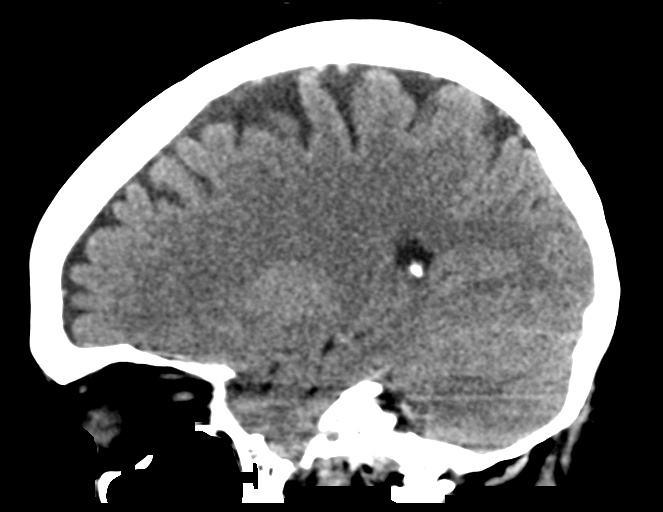

[16 of 47 positions shown; findings below may reference images not displayed]

FINDINGS: Brain: No evidence of acute infarction, hemorrhage, hydrocephalus,
extra-axial collection or mass lesion/mass effect.

Vascular: No hyperdense vessel or unexpected calcification.

Skull: Mild scalp swelling.  Negative for fracture or focal lesion.

Sinuses/Orbits: No acute finding.
IMPRESSION: 1. No evidence of intracranial injury.
2. Mild scalp swelling posteriorly.

## 2024-07-01 ENCOUNTER — Emergency Department (HOSPITAL_BASED_OUTPATIENT_CLINIC_OR_DEPARTMENT_OTHER)
Admission: EM | Admit: 2024-07-01 | Discharge: 2024-07-01 | Disposition: A | Discharge status: Home / Self Care | Attending: Emergency Medicine | Admitting: Emergency Medicine

## 2024-07-01 ENCOUNTER — Emergency Department (HOSPITAL_BASED_OUTPATIENT_CLINIC_OR_DEPARTMENT_OTHER): Admitting: Radiology

## 2024-07-01 ENCOUNTER — Other Ambulatory Visit: Payer: Self-pay

## 2024-07-01 DIAGNOSIS — M79604 Pain in right leg: Secondary | ICD-10-CM | POA: Diagnosis not present

## 2024-07-01 DIAGNOSIS — M25561 Pain in right knee: Secondary | ICD-10-CM | POA: Diagnosis present

## 2024-07-01 DIAGNOSIS — Z79899 Other long term (current) drug therapy: Secondary | ICD-10-CM | POA: Diagnosis not present

## 2024-07-01 DIAGNOSIS — M7989 Other specified soft tissue disorders: Secondary | ICD-10-CM | POA: Diagnosis not present

## 2024-07-01 NOTE — ED Notes (Signed)
 Ace wrap placed on patient's right knee. Patient declines crutches stating she will not need them.

## 2024-07-01 NOTE — Discharge Instructions (Signed)
 Your x-ray imaging was negative for fracture or dislocation.  Your knee exam was overall reassuring with intact range of motion, mild swelling appreciated with no warmth or redness.  If you develop worsening pain with range of motion, development of significant redness, worsening progressive swelling, any new fall or injury, return to the emergency department for repeat evaluation otherwise follow-up with sports medicine in 1 week's time for repeat assessment.

## 2024-07-01 NOTE — ED Provider Notes (Addendum)
 " [place] [facility] AT [facility] Provider Note   CSN: [id number] Arrival date & time: 07/01/24  9275     Patient presents with: Knee Pain (R)   Deborah Cobb is a 64 y.o. female.   HPI   64 year old female with medical history significant for anxiety, substance abuse, bipolar disorder presenting to the emergency department with right knee pain.  The patient states that she has had pain in her right knee since this past Friday.  She denies any obvious injury.  She had to get on her knees on the ground at work and had had some swelling that has persisted to the right knee.  She is able to range the knee but has had some pain with ambulation.  She has been taking Tylenol  for pain control.  Has tried Voltaren gel in the past but states that she cannot take NSAIDs due to an allergy to ibuprofen as she developed hives with ibuprofen in the past.  No redness, warmth to the knee.  Prior to Admission medications  Medication Sig Start Date End Date Taking? Authorizing Provider  FLUoxetine  (PROZAC ) 20 MG capsule Take 1 capsule (20 mg total) by mouth daily. 04/12/18 04/12/19  Arfeen, Leni DASEN, MD  promethazine -dextromethorphan (PROMETHAZINE -DM) 6.25-15 MG/5ML syrup Take 5 mLs by mouth 4 (four) times daily as needed for cough. 05/10/19   McDonald, Mia A, PA-C  traZODone  (DESYREL ) 50 MG tablet Take 1 tablet (50 mg total) by mouth at bedtime as needed for sleep. 04/12/18   Arfeen, Leni DASEN, MD    Allergies: Ibuprofen    Review of Systems  All other systems reviewed and are negative.   Updated Vital Signs BP (!) 151/68   Pulse 67   Temp 98.4 F (36.9 C) (Temporal)   Resp 18   SpO2 98%   Physical Exam Vitals and nursing note reviewed.  Constitutional:      General: She is not in acute distress. HENT:     Head: Normocephalic and atraumatic.  Eyes:     Conjunctiva/sclera: Conjunctivae normal.     Pupils: Pupils are equal, round, and reactive to light.  Cardiovascular:      Rate and Rhythm: Normal rate and regular rhythm.  Pulmonary:     Effort: Pulmonary effort is normal. No respiratory distress.  Abdominal:     General: There is no distension.     Tenderness: There is no guarding.  Musculoskeletal:        General: Swelling and tenderness present. No deformity or signs of injury.     Cervical back: Neck supple.     Comments: Very mild palpable effusion, mild tenderness about the medial aspect of the right knee, distal 2+ pulses intact, active and passive range of motion intact without pain or discomfort, no erythema or warmth.  Negative instability with valgus or varus stress and negative significant pain with valgus and varus stress, negative anterior drawer  Skin:    Findings: No lesion or rash.  Neurological:     General: No focal deficit present.     Mental Status: She is alert. Mental status is at baseline.     (all labs ordered are listed, but only abnormal results are displayed) Labs Reviewed - No data to display  EKG: None  Radiology: DG Knee Complete 4 Views Right Result Date: 07/01/2024 EXAM: 4 VIEW(S) XRAY OF THE RIGHT KNEE 07/01/2024 08:50:00 AM COMPARISON: None available. CLINICAL HISTORY: knee pain knee pain FINDINGS: BONES AND JOINTS: No acute fracture.  No malalignment. No significant joint effusion. SOFT TISSUES: Unremarkable. IMPRESSION: 1. No significant abnormality. Electronically signed by: Evalene Coho MD 07/01/2024 09:14 AM EST RP Workstation: [id number]     Procedures   Medications Ordered in the ED - No data to display                                  Medical Decision Making Amount and/or Complexity of Data Reviewed Radiology: ordered.    64 year old female with medical history significant for anxiety, substance abuse, bipolar disorder presenting to the emergency department with right knee pain.  The patient states that she has had pain in her right knee since this past Friday.  She denies any obvious injury.   She had to get on her knees on the ground at work and had had some swelling that has persisted to the right knee.  She is able to range the knee but has had some pain with ambulation.  She has been taking Tylenol  for pain control.  Has tried Voltaren gel in the past but states that she cannot take NSAIDs due to an allergy to ibuprofen as she developed hives with ibuprofen in the past.  No redness, warmth to the knee.  On arrival, the patient was vitally stable.  Presenting with right knee pain with mild swelling.  No significant erythema, warmth on exam and the patient has intact active and passive range of motion of the knee.  Negative pain with valgus and varus stress, negative anterior drawer.  X-ray of the right knee obtained, negative for appreciable joint effusion, negative for fracture or dislocation.  Low concern for septic arthritis or gout at this time.  No evidence for significant prepatellar bursitis.  Considered meniscal injury versus mild effusion from osteoarthritis.  Do not think that arthrocentesis is indicated at this time as the patient has a very mild palpable swelling, no significant pain with range of motion, no erythema or warmth of the joint itself.  Distal pulses are intact.  Patient was offered crutches, declined, Ace wrap offered for comfort.  Low concern for DVT.  Recommended conservative treatment for the next week, follow-up outpatient with sports medicine for repeat assessment, return precautions provided.     Final diagnoses:  Acute pain of right knee    ED Discharge Orders          Ordered    AMB referral to sports medicine        07/01/24 [id number]               Jerrol Agent, MD 07/01/24 0930    Jerrol Agent, MD 07/01/24 [phone]  "

## 2024-07-01 NOTE — ED Triage Notes (Signed)
 Pt reports outer R knee pain, and anterior and posterior R leg pain, ongoing since Friday. Denies any known obvious injury. Difficulty w/ ambulation. Some relief w/ tylenol . 1000mg  tylenol  at 0430 this morning. Pt reports does have to get on knees on ground at work every night. Swelling noted to R knee.
